# Patient Record
Sex: Female | Born: 1940 | Race: White | Hispanic: No | Marital: Married | Smoking: Former smoker
Health system: Southern US, Community
[De-identification: ages and names within clinical notes are randomized; demographics above are authoritative.]

## PROBLEM LIST (undated history)

## (undated) DIAGNOSIS — M8580 Other specified disorders of bone density and structure, unspecified site: Secondary | ICD-10-CM

## (undated) DIAGNOSIS — R32 Unspecified urinary incontinence: Secondary | ICD-10-CM

## (undated) DIAGNOSIS — N390 Urinary tract infection, site not specified: Secondary | ICD-10-CM

## (undated) DIAGNOSIS — T7840XA Allergy, unspecified, initial encounter: Secondary | ICD-10-CM

## (undated) DIAGNOSIS — R011 Cardiac murmur, unspecified: Secondary | ICD-10-CM

## (undated) DIAGNOSIS — E079 Disorder of thyroid, unspecified: Secondary | ICD-10-CM

## (undated) DIAGNOSIS — K219 Gastro-esophageal reflux disease without esophagitis: Secondary | ICD-10-CM

## (undated) HISTORY — DX: Other specified disorders of bone density and structure, unspecified site: M85.80

## (undated) HISTORY — DX: Cardiac murmur, unspecified: R01.1

## (undated) HISTORY — DX: Allergy, unspecified, initial encounter: T78.40XA

## (undated) HISTORY — DX: Disorder of thyroid, unspecified: E07.9

## (undated) HISTORY — DX: Gastro-esophageal reflux disease without esophagitis: K21.9

## (undated) HISTORY — PX: BREAST BIOPSY: SHX20

## (undated) HISTORY — DX: Unspecified urinary incontinence: R32

## (undated) HISTORY — DX: Urinary tract infection, site not specified: N39.0

---

## 2001-11-05 ENCOUNTER — Other Ambulatory Visit: Admission: RE | Admit: 2001-11-05 | Discharge: 2001-11-05 | Payer: Self-pay | Admitting: Gynecology

## 2002-03-11 ENCOUNTER — Encounter: Payer: Self-pay | Admitting: Gynecology

## 2002-03-11 ENCOUNTER — Encounter: Admission: RE | Admit: 2002-03-11 | Discharge: 2002-03-11 | Payer: Self-pay | Admitting: Gynecology

## 2003-03-03 ENCOUNTER — Other Ambulatory Visit: Admission: RE | Admit: 2003-03-03 | Discharge: 2003-03-03 | Payer: Self-pay | Admitting: Gynecology

## 2004-04-12 ENCOUNTER — Other Ambulatory Visit: Admission: RE | Admit: 2004-04-12 | Discharge: 2004-04-12 | Payer: Self-pay | Admitting: Gynecology

## 2004-06-27 ENCOUNTER — Ambulatory Visit (HOSPITAL_COMMUNITY): Admission: RE | Admit: 2004-06-27 | Discharge: 2004-06-27 | Payer: Self-pay | Admitting: Gynecology

## 2004-09-04 ENCOUNTER — Emergency Department (HOSPITAL_COMMUNITY): Admission: EM | Admit: 2004-09-04 | Discharge: 2004-09-04 | Payer: Self-pay | Admitting: Family Medicine

## 2005-05-03 ENCOUNTER — Other Ambulatory Visit: Admission: RE | Admit: 2005-05-03 | Discharge: 2005-05-03 | Payer: Self-pay | Admitting: Gynecology

## 2005-12-13 ENCOUNTER — Ambulatory Visit (HOSPITAL_COMMUNITY): Admission: RE | Admit: 2005-12-13 | Discharge: 2005-12-13 | Payer: Self-pay | Admitting: Gynecology

## 2006-06-11 ENCOUNTER — Other Ambulatory Visit: Admission: RE | Admit: 2006-06-11 | Discharge: 2006-06-11 | Payer: Self-pay | Admitting: Gynecology

## 2007-02-21 HISTORY — PX: ENDOMETRIAL BIOPSY: SHX622

## 2007-08-06 ENCOUNTER — Encounter (INDEPENDENT_AMBULATORY_CARE_PROVIDER_SITE_OTHER): Payer: Self-pay | Admitting: Gynecology

## 2007-08-06 ENCOUNTER — Ambulatory Visit (HOSPITAL_BASED_OUTPATIENT_CLINIC_OR_DEPARTMENT_OTHER): Admission: RE | Admit: 2007-08-06 | Discharge: 2007-08-06 | Payer: Self-pay | Admitting: Gynecology

## 2008-05-25 ENCOUNTER — Encounter: Admission: RE | Admit: 2008-05-25 | Discharge: 2008-05-25 | Payer: Self-pay | Admitting: Gynecology

## 2009-03-19 ENCOUNTER — Encounter: Admission: RE | Admit: 2009-03-19 | Discharge: 2009-03-19 | Payer: Self-pay | Admitting: Family Medicine

## 2009-04-19 ENCOUNTER — Encounter: Admission: RE | Admit: 2009-04-19 | Discharge: 2009-04-19 | Payer: Self-pay | Admitting: Family Medicine

## 2009-08-26 ENCOUNTER — Encounter: Admission: RE | Admit: 2009-08-26 | Discharge: 2009-08-26 | Payer: Self-pay | Admitting: Family Medicine

## 2009-09-01 ENCOUNTER — Encounter: Admission: RE | Admit: 2009-09-01 | Discharge: 2009-09-01 | Payer: Self-pay | Admitting: Gynecology

## 2010-07-05 NOTE — Op Note (Signed)
NAMEJESSLYN, VIGLIONE NO.:  [id number]   MEDICAL RECORD NO.:  [id number]          PATIENT TYPE:  AMB   LOCATION:  [facility]                         FACILITY:  [facility]   PHYSICIAN:  Gretta Cool, M.D. DATE OF BIRTH:  April 26, 1940   DATE OF PROCEDURE:  08/06/2007  DATE OF DISCHARGE:                               OPERATIVE REPORT   PREOPERATIVE DIAGNOSES:  1. Endometrial polyp, with abnormal uterine bleeding.  2. 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   POSTOPERATIVE DIAGNOSES:  1. Endometrial polyp, with abnormal uterine bleeding.  2. 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  3. Superficial adenomyosis.   SURGEON:  Gretta Cool, M.D.   ANESTHESIA:  IV sedation, and paracervical block.   DESCRIPTION OF PROCEDURE:  Under excellent anesthesia, as above, with  the patient prepped and draped in the lithotomy position in Power  stirrups, bladder drained.  A weighted speculum was placed in the  vagina.  The cervix was then grasped with a single-tooth tenaculum and,  after paracervical anesthesia was administered, the cervix was  progressively dilated.  The cervix was dilated to 33 Pratt, and the 7 mm  resectoscope introduced.  The cavity was then photographed, and a large  anterior uterine wall sessile polyp demonstrated.  There was evidence of  previous partial resection of the polyp.  At this point, note the  patient had had a D&C in the office previously.  The polyp was then  progressively resected totally.  Once the polyp was resected, the entire  endometrial cavity was progressively resected so as to remove all viable  endometrial tissue down to a depth of 5 mm or greater into the muscle  wall of the uterus.  Cornual areas were treated by touch technique.  At  this point, the Vapotrode electrode was applied, and the entire cavity  treated with Vapotrode 360 degrees around the cavity so as to eliminate  islands of superficial adenomyosis and  endometriosis in the surface of  the remaining myometrial wall.  At this point, with reduced pressure,  there was no significant bleeding.  The patient returned to the recovery  room in excellent condition, without complication.           ______________________________  Gretta Cool, M.D.     CWL/MEDQ  D:  08/06/2007  T:  08/06/2007  Job:  [id number]   cc:   Ace Gins, MD

## 2010-11-17 LAB — POCT HEMOGLOBIN-HEMACUE: Operator id: 114531

## 2010-11-21 ENCOUNTER — Other Ambulatory Visit: Payer: Self-pay | Admitting: Gynecology

## 2010-12-12 IMAGING — CT CT CHEST W/O CM
2 of 3 series · 14 of 31 positions shown, 16 images · non-contrast
Comparison: CT of the chest of 04/19/2009

CLINICAL DATA: Follow up of ground-glass lesions noted on prior CT
of the chest

CT CHEST WITHOUT CONTRAST
TECHNIQUE: Multidetector CT imaging of the chest was performed
following the standard protocol without IV contrast.

[Series 3: routine chest · axial · 0.66mm/px · z∈[-205,-5]mm · 6 of 57 slices shown, 8 images]
[im 9/57  mediastinal]
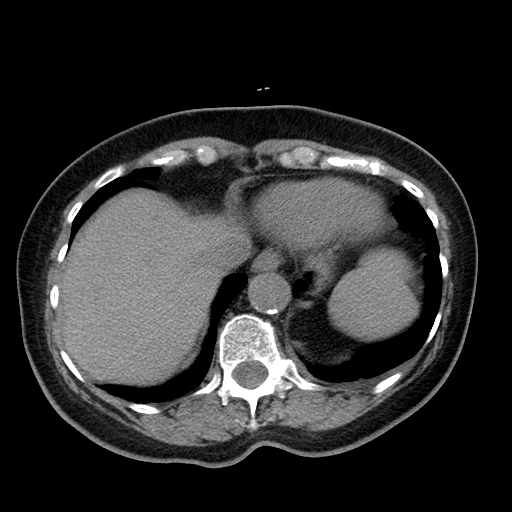
[im 9/57  lung]
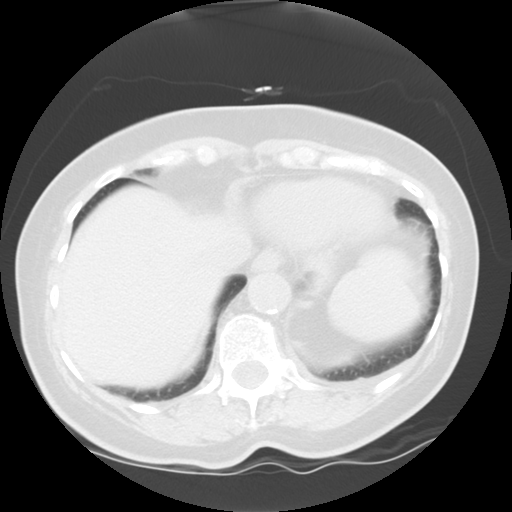
[im 17/57  lung]
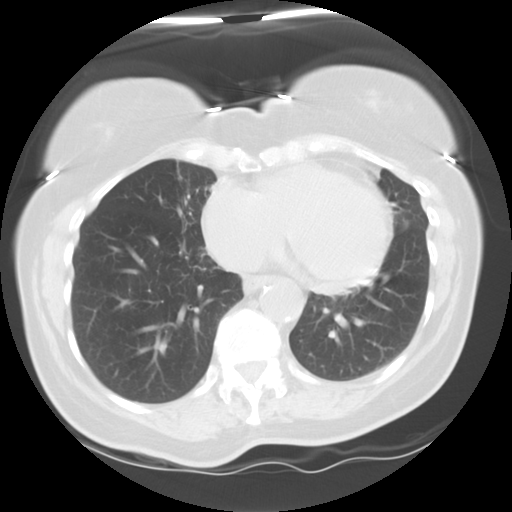
[im 25/57  lung]
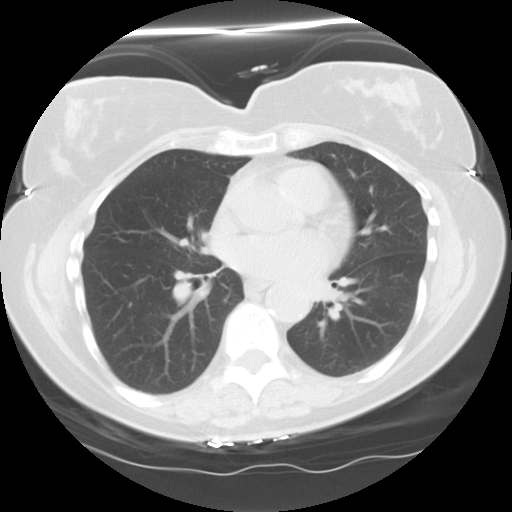
[im 33/57  lung]
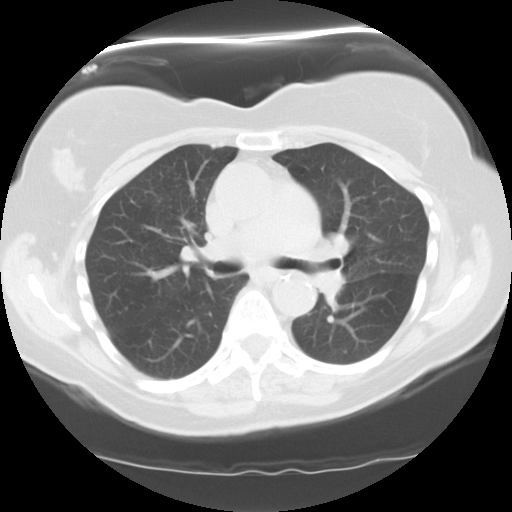
[im 41/57  mediastinal]
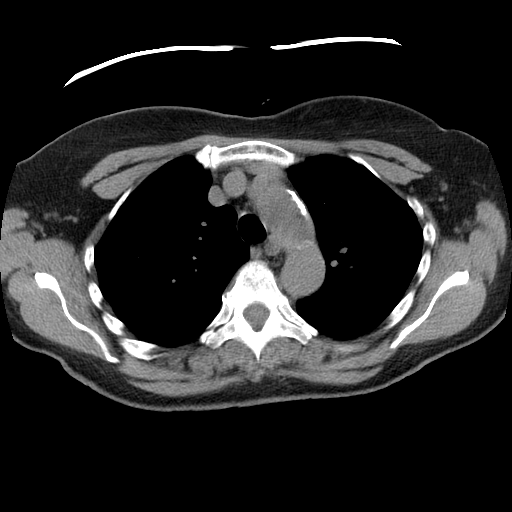
[im 41/57  lung]
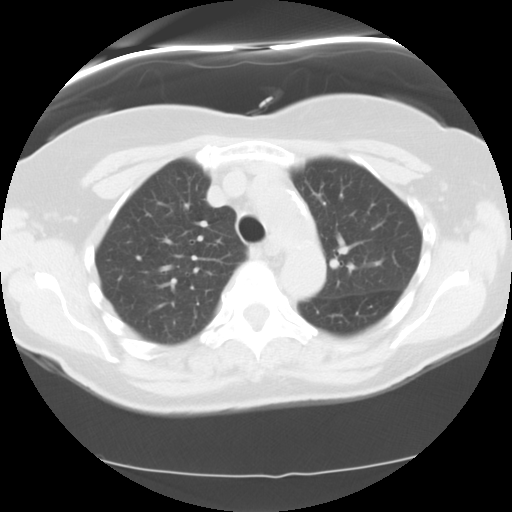
[im 49/57  lung]
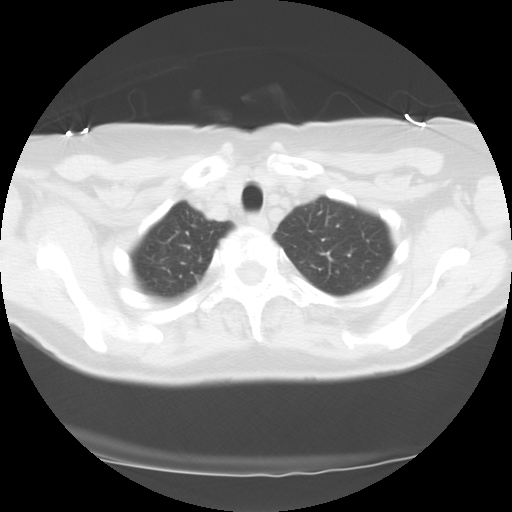

[Series 602: sagittal body · sagittal · 0.66mm/px · 8 of 136 slices shown]
[im 15/136  mediastinal]
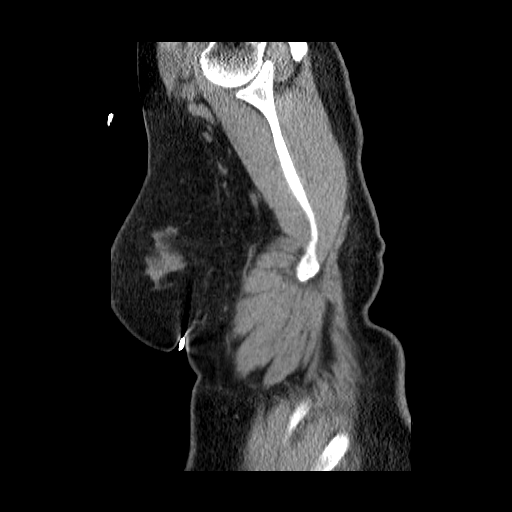
[im 29/136  mediastinal]
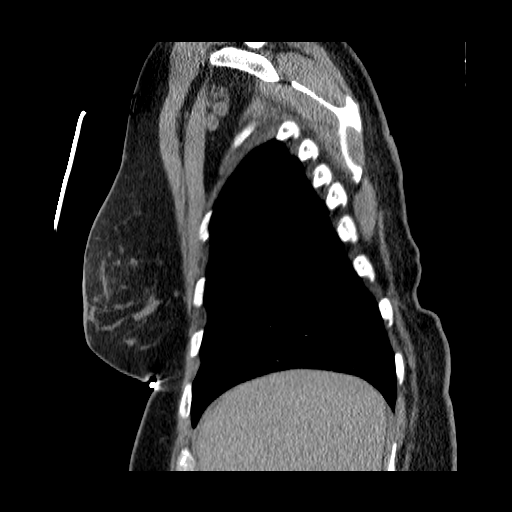
[im 43/136  mediastinal]
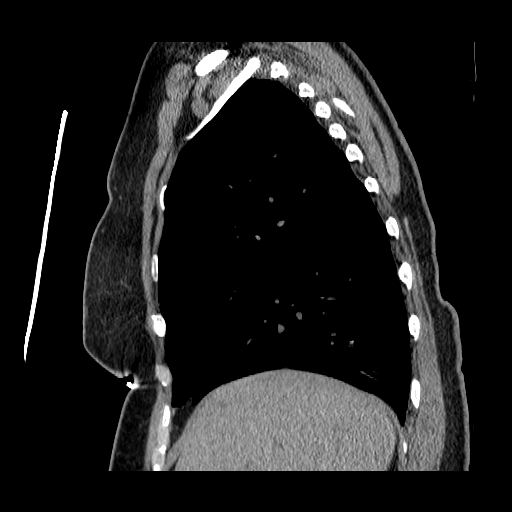
[im 64/136  mediastinal]
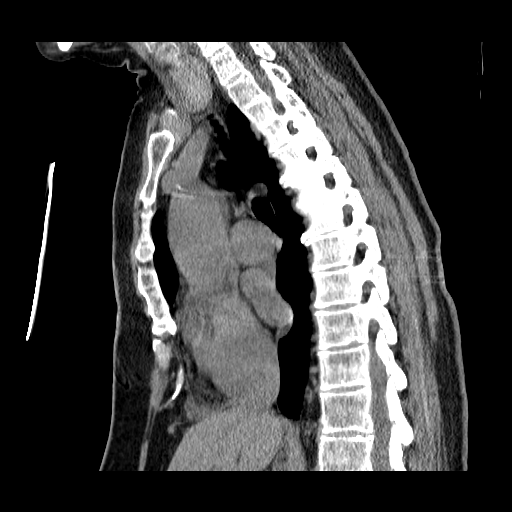
[im 72/136  mediastinal]
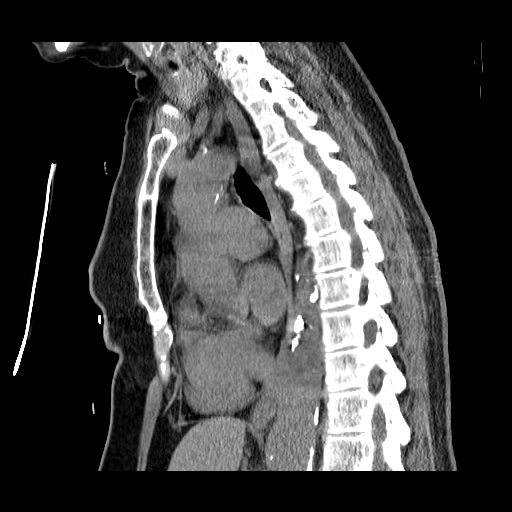
[im 93/136  mediastinal]
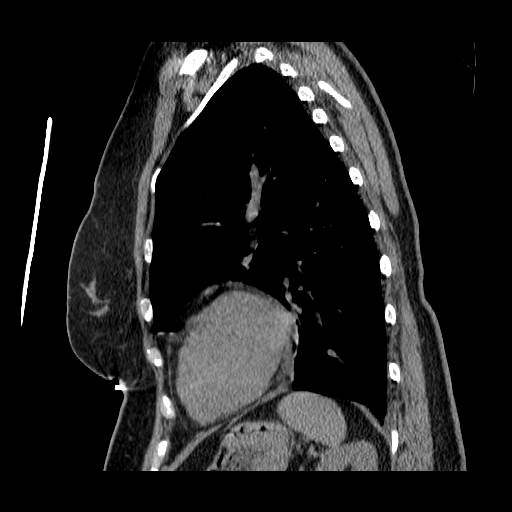
[im 107/136  mediastinal]
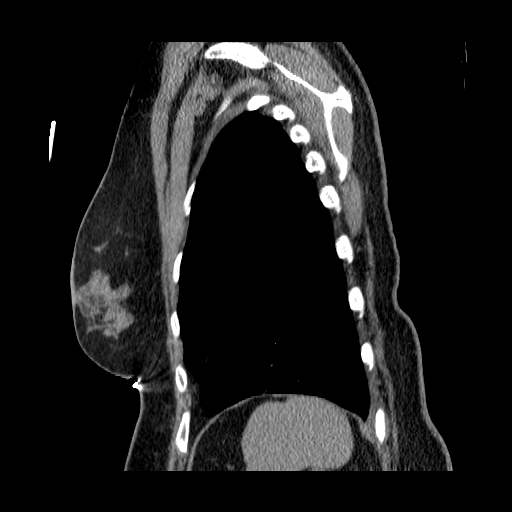
[im 121/136  mediastinal]
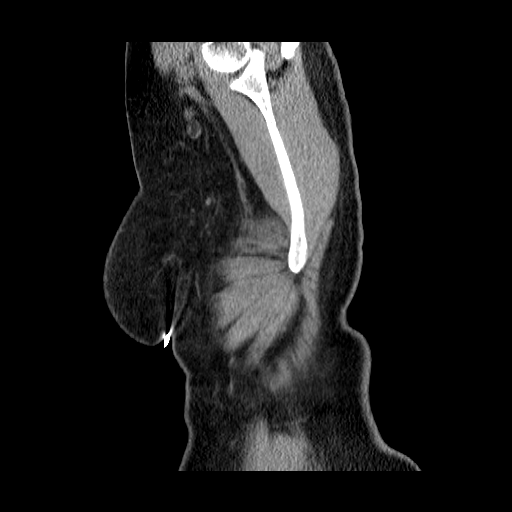

[14 of 31 positions shown; findings below may reference images not displayed]

FINDINGS: The more prominent of the previously noted three ground-
glass opacities on the right is stable within the right lower lobe.
A smaller ground-glass opacity in the right middle lobe is stable.
The very tiny ground-glass opacity questioned previously in the
right lower lobe more posteriorly is not seen on the current exam.
The calcified granuloma in the right upper lobe is stable.  No new
lung nodule is seen.  Since ground-glass opacities may represent
bronchoalveolar cell carcinoma and can be very slow growing,
follow-up CT of the chest is recommended in 6-12 months.  No
pleural effusion is seen.

On soft tissue window images, no mediastinal or hilar adenopathy is
seen.  Somewhat prominent pulmonary artery segments are stable.
Calcification of the descending thoracic aorta is again noted.
Mild cardiomegaly is stable.  No abnormality is seen of the upper
abdomen.
IMPRESSION: Two of the three previously described ground-glass opacities on
the right are stable, and the third previously described opacity is
no longer seen.  Recommend an additional follow-up CT chest in 6-12
months.

## 2011-06-14 ENCOUNTER — Encounter: Payer: Self-pay | Admitting: Family Medicine

## 2011-06-14 ENCOUNTER — Ambulatory Visit (INDEPENDENT_AMBULATORY_CARE_PROVIDER_SITE_OTHER): Payer: Medicare Other | Admitting: Family Medicine

## 2011-06-14 VITALS — BP 132/84 | HR 72 | Temp 98.0°F | Resp 12 | Ht 67.25 in | Wt 164.0 lb

## 2011-06-14 DIAGNOSIS — K219 Gastro-esophageal reflux disease without esophagitis: Secondary | ICD-10-CM

## 2011-06-14 DIAGNOSIS — E039 Hypothyroidism, unspecified: Secondary | ICD-10-CM | POA: Insufficient documentation

## 2011-06-14 DIAGNOSIS — J309 Allergic rhinitis, unspecified: Secondary | ICD-10-CM

## 2011-06-14 DIAGNOSIS — J3089 Other allergic rhinitis: Secondary | ICD-10-CM

## 2011-06-14 DIAGNOSIS — F5104 Psychophysiologic insomnia: Secondary | ICD-10-CM | POA: Insufficient documentation

## 2011-06-14 DIAGNOSIS — G4700 Insomnia, unspecified: Secondary | ICD-10-CM | POA: Diagnosis not present

## 2011-06-14 NOTE — Progress Notes (Signed)
  Subjective:    Patient ID: [name] Stable, female    DOB: [birth year], 71 y.o.   MRN: [id number]  HPI  New to establish care.  PMH reviewed. History of childhood asthma but no problems since age 69. History of GERD on Nexium. She's tried stopping several times without success. She has hypothyroidism treated with armour thyroid. She tried synthetic thyroid but had side effects. She has history of some urine incontinence symptoms. Perennial allergies. Tried over-the-counter anti histamines without much improvement. She has not had thyroid functions in over one year.  History of benign endometrial polyp 2009. She sees gynecologist regularly.  FH significant for mother with stroke and hypertension history. Paternal grandfather with colon cancer.  Patient is married. Ex-smoker. One alcoholic beverage per day.  Has refused colonoscopy screening in the past. Pneumovax and tetanus up-to-date. Previous shingles vaccine 2011   Review of Systems  Constitutional: Negative for fever, activity change, appetite change, fatigue and unexpected weight change.  HENT: Negative for hearing loss, ear pain, sore throat and trouble swallowing.   Eyes: Negative for visual disturbance.  Respiratory: Negative for cough and shortness of breath.   Cardiovascular: Negative for chest pain and palpitations.  Gastrointestinal: Negative for abdominal pain, diarrhea, constipation and blood in stool.  Genitourinary: Negative for dysuria and hematuria.  Musculoskeletal: Negative for myalgias, back pain and arthralgias.  Skin: Negative for rash.  Neurological: Negative for dizziness, syncope and headaches.  Hematological: Negative for adenopathy.  Psychiatric/Behavioral: Negative for confusion and dysphoric mood.       Objective:   Physical Exam  Constitutional: She is oriented to person, place, and time. She appears well-developed and well-nourished.  HENT:  Head: Normocephalic and atraumatic.  Eyes: EOM are normal.  Pupils are equal, round, and reactive to light.  Neck: Normal range of motion. Neck supple. No thyromegaly present.  Cardiovascular: Normal rate, regular rhythm and normal heart sounds.   No murmur heard. Pulmonary/Chest: Breath sounds normal. No respiratory distress. She has no wheezes. She has no rales.  Abdominal: Soft. Bowel sounds are normal. She exhibits no distension and no mass. There is no tenderness. There is no rebound and no guarding.  Musculoskeletal: Normal range of motion. She exhibits no edema.  Lymphadenopathy:    She has no cervical adenopathy.  Neurological: She is alert and oriented to person, place, and time. She displays normal reflexes. No cranial nerve deficit.  Skin: No rash noted.  Psychiatric: She has a normal mood and affect. Her behavior is normal. Judgment and thought content normal.          Assessment & Plan:  #1 perennial allergies. We'll try to get prior authorization for XYZAL.   #2 hypothyroidism. Recheck TSH #3 history of GERD stable on Nexium. Continue current medication. She's tried stopping several times in the past without success.

## 2011-06-15 NOTE — Progress Notes (Signed)
Quick Note:  Pt informed, copy mailed to her home ______ 

## 2012-01-02 DIAGNOSIS — G4700 Insomnia, unspecified: Secondary | ICD-10-CM | POA: Diagnosis not present

## 2012-01-02 DIAGNOSIS — N926 Irregular menstruation, unspecified: Secondary | ICD-10-CM | POA: Diagnosis not present

## 2012-02-01 ENCOUNTER — Encounter: Payer: Self-pay | Admitting: Family Medicine

## 2012-02-01 ENCOUNTER — Ambulatory Visit (INDEPENDENT_AMBULATORY_CARE_PROVIDER_SITE_OTHER): Payer: Medicare Other | Admitting: Family Medicine

## 2012-02-01 VITALS — BP 150/90 | Temp 98.0°F | Wt 159.0 lb

## 2012-02-01 DIAGNOSIS — J209 Acute bronchitis, unspecified: Secondary | ICD-10-CM | POA: Diagnosis not present

## 2012-02-01 MED ORDER — BENZONATATE 200 MG PO CAPS: 200.0000 mg | ORAL_CAPSULE | Freq: Three times a day (TID) | ORAL | Status: DC | PRN

## 2012-02-01 MED ORDER — HYDROCODONE-HOMATROPINE 5-1.5 MG/5ML PO SYRP: 5.0000 mL | ORAL_SOLUTION | Freq: Four times a day (QID) | ORAL | Status: AC | PRN

## 2012-02-01 NOTE — Patient Instructions (Addendum)

## 2012-02-01 NOTE — Progress Notes (Signed)
  Subjective:    Patient ID: Taylor Kelley, female    DOB: 1940-08-29, 71 y.o.   MRN: [id number]  HPI  One-week history sore throat dry cough and mild malaise. No postnasal drip. Sore throat especially at night. Cough is worse at night. No fever or chills. Took Aleve without much relief. No headaches. No nausea or vomiting. No sick contacts. Quit smoking 1980   Review of Systems  Constitutional: Negative for fever, chills and fatigue.  HENT: Positive for congestion and sore throat. Negative for voice change.   Respiratory: Positive for cough. Negative for shortness of breath and wheezing.   Neurological: Negative for headaches.       Objective:   Physical Exam  Constitutional: She appears well-developed and well-nourished.  HENT:  Right Ear: External ear normal.  Left Ear: External ear normal.  Mouth/Throat: Oropharynx is clear and moist.  Neck: Neck supple. No thyromegaly present.  Cardiovascular: Normal rate and regular rhythm.   Pulmonary/Chest: Effort normal and breath sounds normal. No respiratory distress. She has no wheezes. She has no rales.  Lymphadenopathy:    She has no cervical adenopathy.          Assessment & Plan:  Acute bronchitis. Suspect viral. Hycodan cough syrup for nighttime use as needed

## 2012-02-28 ENCOUNTER — Other Ambulatory Visit: Payer: Self-pay | Admitting: *Deleted

## 2012-02-28 MED ORDER — ESOMEPRAZOLE MAGNESIUM 40 MG PO CPDR: 40.0000 mg | DELAYED_RELEASE_CAPSULE | Freq: Every day | ORAL | Status: DC

## 2012-05-24 ENCOUNTER — Other Ambulatory Visit: Payer: Self-pay | Admitting: *Deleted

## 2012-05-24 MED ORDER — THYROID 60 MG PO TABS: 60.0000 mg | ORAL_TABLET | Freq: Every day | ORAL | Status: DC

## 2012-07-09 ENCOUNTER — Other Ambulatory Visit: Payer: Self-pay | Admitting: Family Medicine

## 2012-10-02 ENCOUNTER — Other Ambulatory Visit: Payer: Self-pay | Admitting: Family Medicine

## 2012-12-26 ENCOUNTER — Ambulatory Visit: Payer: Self-pay | Admitting: Gynecology

## 2012-12-28 ENCOUNTER — Other Ambulatory Visit: Payer: Self-pay | Admitting: Family Medicine

## 2013-01-03 ENCOUNTER — Ambulatory Visit: Payer: Self-pay | Admitting: Gynecology

## 2013-01-09 ENCOUNTER — Ambulatory Visit: Payer: Self-pay | Admitting: Gynecology

## 2013-01-20 ENCOUNTER — Encounter: Payer: Self-pay | Admitting: Gynecology

## 2013-01-20 ENCOUNTER — Ambulatory Visit (INDEPENDENT_AMBULATORY_CARE_PROVIDER_SITE_OTHER): Payer: Medicare Other | Admitting: Gynecology

## 2013-01-20 VITALS — BP 130/82 | Ht 67.0 in | Wt 162.0 lb

## 2013-01-20 DIAGNOSIS — N952 Postmenopausal atrophic vaginitis: Secondary | ICD-10-CM

## 2013-01-20 DIAGNOSIS — Z780 Asymptomatic menopausal state: Secondary | ICD-10-CM | POA: Diagnosis not present

## 2013-01-20 DIAGNOSIS — Z79890 Hormone replacement therapy (postmenopausal): Secondary | ICD-10-CM

## 2013-01-20 MED ORDER — PROGESTERONE MICRONIZED 200 MG PO CAPS: 200.0000 mg | ORAL_CAPSULE | Freq: Every day | ORAL | Status: DC

## 2013-01-20 MED ORDER — ESTRADIOL 0.0375 MG/24HR TD PTWK: 0.0375 mg | MEDICATED_PATCH | TRANSDERMAL | Status: DC

## 2013-01-20 NOTE — Progress Notes (Signed)
Taylor Kelley Dec 23, 1940 [id number]        72 y.o.  G3P3003 new patient for followup exam.  Former patient of Dr. Nicholas Lose. Several issues noted below.  Past medical history,surgical history, problem list, medications, allergies, family history and social history were all reviewed and documented in the EPIC chart.  ROS:  Performed and pertinent positives and negatives are included in the history, assessment and plan .  Exam:  Kim assistant Filed Vitals:   01/20/13 1021  BP: 130/82  Height: 5\' 7"  (1.702 m)  Weight: 162 lb (73.483 kg)   General appearance  Normal Skin grossly normal Head/Neck normal with no cervical or supraclavicular adenopathy thyroid normal Lungs  clear Cardiac RR, without RMG Abdominal  soft, nontender, without masses, organomegaly or hernia Breasts  examined lying and sitting without masses, retractions, discharge or axillary adenopathy. Pelvic  Ext/BUS/vagina  Normal with atrophic changes  Cervix  with atrophic changes. Old obstetrical lacerations.  Uterus  axial, normal size, shape and contour, midline and mobile nontender   Adnexa  Without masses or tenderness    Anus and perineum  Normal   Rectovaginal  Normal sphincter tone without palpated masses or tenderness.    Assessment/Plan:  72 y.o. G3P3003 new patient.  1. Postmenopausal/HRT/atrophic genital changes. Patient on HRT to include Climara 0.0375 patch and Prometrium 200 mg 12 days every 3 months. No withdrawal bleeding. Has been on this for years without attempted weaning.  I reviewed the whole issue of HRT with her to include the WHI study with increased risk of stroke, heart attack, DVT and breast cancer. The ACOG and NAMS statements for lowest dose for the shortest period of time reviewed. Transdermal versus oral first-pass effect benefit discussed. After lengthy discussion she agrees to weaning off of the HRT. Assuming she continues well them Will follow. If she does have unacceptable hot flashes and  sweats and wants to reinitiate she will call. 2. Pap smear 2012. No Pap smear done today. No history of abnormal Pap smear previously. Reviewed current screening guidelines we both agreed to stop screening and she is comfortable with this. 3. Mammography due now she knows to schedule this. SBE monthly reviewed. 4. DEXA 2007. Recommend repeat DEXA now and she agrees to do so. Increase calcium vitamin D reviewed. 5. Colonoscopy never. Benefits of pre-cancerous polyp removal and early detection discussed. Strongly recommended patient schedule colonoscopy and she refuses. 6. Health maintenance. No blood work done as this will be done through her primary physician's office who she is arranging an appointment with. Followup one year, sooner as needed.   Note: This document was prepared with digital dictation and possible smart phrase technology. Any transcriptional errors that result from this process are unintentional.   Dara Lords MD, 11:12 AM 01/20/2013

## 2013-01-20 NOTE — Patient Instructions (Signed)
Recommend screening colonoscopy. Wean from hormone replacement as we discussed. Call if any issues. Followup in one year for annual exam.

## 2013-01-21 LAB — URINALYSIS W MICROSCOPIC + REFLEX CULTURE
Bacteria, UA: NONE SEEN
Bilirubin Urine: NEGATIVE
Crystals: NONE SEEN
Glucose, UA: NEGATIVE mg/dL
Ketones, ur: NEGATIVE mg/dL
Nitrite: NEGATIVE
Protein, ur: NEGATIVE mg/dL
Squamous Epithelial / LPF: NONE SEEN
Urobilinogen, UA: 0.2 mg/dL (ref 0.0–1.0)
pH: 5.5 (ref 5.0–8.0)

## 2013-01-27 ENCOUNTER — Ambulatory Visit (INDEPENDENT_AMBULATORY_CARE_PROVIDER_SITE_OTHER): Payer: Medicare Other | Admitting: Family Medicine

## 2013-01-27 ENCOUNTER — Encounter: Payer: Self-pay | Admitting: Family Medicine

## 2013-01-27 VITALS — BP 130/72 | HR 79 | Temp 98.3°F | Wt 163.0 lb

## 2013-01-27 DIAGNOSIS — K219 Gastro-esophageal reflux disease without esophagitis: Secondary | ICD-10-CM

## 2013-01-27 DIAGNOSIS — G4700 Insomnia, unspecified: Secondary | ICD-10-CM

## 2013-01-27 DIAGNOSIS — R5381 Other malaise: Secondary | ICD-10-CM | POA: Diagnosis not present

## 2013-01-27 DIAGNOSIS — E039 Hypothyroidism, unspecified: Secondary | ICD-10-CM

## 2013-01-27 DIAGNOSIS — R5383 Other fatigue: Secondary | ICD-10-CM

## 2013-01-27 DIAGNOSIS — F5104 Psychophysiologic insomnia: Secondary | ICD-10-CM

## 2013-01-27 LAB — VITAMIN B12: Vitamin B-12: >1500 pg/mL — ABNORMAL HIGH (ref 211–911)

## 2013-01-27 MED ORDER — ESOMEPRAZOLE MAGNESIUM 40 MG PO CPDR: 40.0000 mg | DELAYED_RELEASE_CAPSULE | Freq: Every day | ORAL | Status: DC

## 2013-01-27 MED ORDER — THYROID 60 MG PO TABS: 60.0000 mg | ORAL_TABLET | Freq: Every day | ORAL | Status: DC

## 2013-01-27 MED ORDER — TRAZODONE HCL 150 MG PO TABS: ORAL_TABLET | ORAL | Status: DC

## 2013-01-27 MED ORDER — LEVOCETIRIZINE DIHYDROCHLORIDE 5 MG PO TABS: 5.0000 mg | ORAL_TABLET | Freq: Every evening | ORAL | Status: DC

## 2013-01-27 NOTE — Progress Notes (Signed)
Pre visit review using our clinic review tool, if applicable. No additional management support is needed unless otherwise documented below in the visit note. 

## 2013-01-27 NOTE — Progress Notes (Signed)
   Subjective:    Patient ID: Taylor Kelley, female    DOB: June 14, 1940, 72 y.o.   MRN: [id number]  HPI Patient is seen for medical followup. She has history of chronic insomnia, GERD, hypothyroidism, and perennial allergic rhinitis. She still sees gynecologist. He recommended she scale back on her estrogen use. She has allergies that are controlled with Levocetirizine. GERD which has been controlled on Nexium. She's tried stopping this multiple times with recurrent symptoms. She takes Armour Thyroid and consistent with using this. She has chronic insomnia takes trazodone at night. She has chronic fatigue. Exercises 3-4 days per week about 30 minutes per day but still feels fatigued. Does have poor sleep still at times. No chest pains. No dizziness. Denies any depressive symptoms.  Past Medical History  Diagnosis Date  . GERD (gastroesophageal reflux disease)   . Allergy   . Heart murmur   . Thyroid disease   . Urine incontinence   . UTI (urinary tract infection)    Past Surgical History  Procedure Laterality Date  . Endometrial biopsy  2009    polyp    reports that she quit smoking about 34 years ago. Her smoking use included Cigarettes. She has a 10 pack-year smoking history. She does not have any smokeless tobacco history on file. She reports that she drinks about 3.5 ounces of alcohol per week. She reports that she does not use illicit drugs. family history includes Breast cancer (age of onset: 49) in her other; Breast cancer (age of onset: 7) in her sister; Cancer in her paternal grandfather; Heart disease in her father and mother; Hypertension in her mother; Stroke in her mother. No Known Allergies    Review of Systems  Constitutional: Positive for fatigue.  Eyes: Negative for visual disturbance.  Respiratory: Negative for cough, chest tightness, shortness of breath and wheezing.   Cardiovascular: Negative for chest pain, palpitations and leg swelling.  Neurological: Negative  for dizziness, seizures, syncope, weakness, light-headedness and headaches.       Objective:   Physical Exam  Constitutional: She appears well-developed and well-nourished. No distress.  HENT:  Mouth/Throat: Oropharynx is clear and moist.  Neck: Neck supple. No thyromegaly present.  Cardiovascular: Normal rate and regular rhythm.   Pulmonary/Chest: Effort normal and breath sounds normal. No respiratory distress. She has no wheezes. She has no rales.  Musculoskeletal: She exhibits no edema.          Assessment & Plan:  #1 hypothyroidism. Recheck TSH #2 history of chronic GERD. Chronic PPI use. Check B12 level #3 fatigue. Likely multifactorial. Check TSH, check B12, check vitamin D level. #4 chronic insomnia. We've talked about sleep hygiene. Recommend she try to scale back her trazodone to one half tablet at night

## 2013-02-26 ENCOUNTER — Telehealth: Payer: Self-pay | Admitting: Family Medicine

## 2013-02-26 NOTE — Telephone Encounter (Signed)
The PA for Armour has been denied, it is not covered under the pt's plan.  I was not given any alternative medications.

## 2013-02-26 NOTE — Telephone Encounter (Signed)
If this is not covered, her options are:  #1) pay out of pocket for medication  #2) go on synthetic hormone- which is what most people take.  Let me know if she is willing to go on synthetic hormone.

## 2013-02-27 NOTE — Telephone Encounter (Signed)
Pt stated that she will pay out of pocket for the medication.

## 2013-03-04 ENCOUNTER — Telehealth: Payer: Self-pay | Admitting: *Deleted

## 2013-03-04 MED ORDER — ESTRADIOL 0.0375 MG/24HR TD PTWK: 0.0375 mg | MEDICATED_PATCH | TRANSDERMAL | Status: DC

## 2013-03-04 MED ORDER — PROGESTERONE MICRONIZED 100 MG PO CAPS: ORAL_CAPSULE | ORAL | Status: DC

## 2013-03-04 NOTE — Telephone Encounter (Signed)
Pt tried to wean off Climara 0.0375 patch and Prometrium 200 mg 12 days every 3 months as noted on 01/20/13 OV note. Pt said hot flashes and no sleep has returned. Please advise

## 2013-03-04 NOTE — Telephone Encounter (Signed)
Would recommend Climara 0.0375 patch and Prometrium 100 mg nightly

## 2013-03-04 NOTE — Telephone Encounter (Signed)
Pt called informed with the below note, rx sent.

## 2013-04-20 DIAGNOSIS — M8580 Other specified disorders of bone density and structure, unspecified site: Secondary | ICD-10-CM

## 2013-04-20 HISTORY — DX: Other specified disorders of bone density and structure, unspecified site: M85.80

## 2013-05-06 ENCOUNTER — Encounter: Payer: Self-pay | Admitting: Gynecology

## 2013-05-06 ENCOUNTER — Ambulatory Visit (INDEPENDENT_AMBULATORY_CARE_PROVIDER_SITE_OTHER): Payer: Medicare Other

## 2013-05-06 ENCOUNTER — Other Ambulatory Visit: Payer: Self-pay | Admitting: Gynecology

## 2013-05-06 DIAGNOSIS — M8580 Other specified disorders of bone density and structure, unspecified site: Secondary | ICD-10-CM

## 2013-05-06 DIAGNOSIS — M899 Disorder of bone, unspecified: Secondary | ICD-10-CM | POA: Diagnosis not present

## 2013-05-06 DIAGNOSIS — M949 Disorder of cartilage, unspecified: Secondary | ICD-10-CM

## 2013-05-06 DIAGNOSIS — Z780 Asymptomatic menopausal state: Secondary | ICD-10-CM

## 2013-06-12 ENCOUNTER — Encounter (HOSPITAL_COMMUNITY): Payer: Self-pay | Admitting: Emergency Medicine

## 2013-06-12 ENCOUNTER — Emergency Department (HOSPITAL_COMMUNITY)
Admission: EM | Admit: 2013-06-12 | Discharge: 2013-06-12 | Disposition: A | Discharge status: Home / Self Care | Payer: Medicare Other | Attending: Emergency Medicine | Admitting: Emergency Medicine

## 2013-06-12 ENCOUNTER — Emergency Department (HOSPITAL_COMMUNITY): Payer: Medicare Other

## 2013-06-12 DIAGNOSIS — S0003XA Contusion of scalp, initial encounter: Secondary | ICD-10-CM | POA: Diagnosis not present

## 2013-06-12 DIAGNOSIS — M949 Disorder of cartilage, unspecified: Secondary | ICD-10-CM

## 2013-06-12 DIAGNOSIS — S199XXA Unspecified injury of neck, initial encounter: Secondary | ICD-10-CM | POA: Diagnosis not present

## 2013-06-12 DIAGNOSIS — Z7982 Long term (current) use of aspirin: Secondary | ICD-10-CM | POA: Insufficient documentation

## 2013-06-12 DIAGNOSIS — E079 Disorder of thyroid, unspecified: Secondary | ICD-10-CM | POA: Diagnosis not present

## 2013-06-12 DIAGNOSIS — R32 Unspecified urinary incontinence: Secondary | ICD-10-CM | POA: Insufficient documentation

## 2013-06-12 DIAGNOSIS — Z79899 Other long term (current) drug therapy: Secondary | ICD-10-CM | POA: Insufficient documentation

## 2013-06-12 DIAGNOSIS — S0033XA Contusion of nose, initial encounter: Secondary | ICD-10-CM

## 2013-06-12 DIAGNOSIS — Z87891 Personal history of nicotine dependence: Secondary | ICD-10-CM | POA: Diagnosis not present

## 2013-06-12 DIAGNOSIS — R011 Cardiac murmur, unspecified: Secondary | ICD-10-CM | POA: Insufficient documentation

## 2013-06-12 DIAGNOSIS — S0083XA Contusion of other part of head, initial encounter: Principal | ICD-10-CM | POA: Insufficient documentation

## 2013-06-12 DIAGNOSIS — S1093XA Contusion of unspecified part of neck, initial encounter: Secondary | ICD-10-CM | POA: Diagnosis not present

## 2013-06-12 DIAGNOSIS — W010XXA Fall on same level from slipping, tripping and stumbling without subsequent striking against object, initial encounter: Secondary | ICD-10-CM | POA: Insufficient documentation

## 2013-06-12 DIAGNOSIS — S0993XA Unspecified injury of face, initial encounter: Secondary | ICD-10-CM | POA: Diagnosis not present

## 2013-06-12 DIAGNOSIS — Y9389 Activity, other specified: Secondary | ICD-10-CM | POA: Insufficient documentation

## 2013-06-12 DIAGNOSIS — Y929 Unspecified place or not applicable: Secondary | ICD-10-CM | POA: Insufficient documentation

## 2013-06-12 DIAGNOSIS — M899 Disorder of bone, unspecified: Secondary | ICD-10-CM | POA: Insufficient documentation

## 2013-06-12 DIAGNOSIS — IMO0002 Reserved for concepts with insufficient information to code with codable children: Secondary | ICD-10-CM | POA: Insufficient documentation

## 2013-06-12 NOTE — ED Notes (Addendum)
Pt reports tripping and striking nose on metal bar. Pt now reports 5/10 pain to nose. No LOC, denies HA. PERRLA 16mm, neuro intact. Concerned nose could be broken. Reports nose bleeding initially, but no bleeding at this time. AO x4.

## 2013-06-12 NOTE — ED Provider Notes (Signed)
CSN: [id number]     Arrival date & time 06/12/13  8416 History  This chart was scribed for non-physician practitioner, Junius Creamer, [place] working with Merryl Hacker, MD by Frederich Balding, ED scribe. This patient was seen in room TR09C/TR09C and the patient's care was started at 7:25 PM.   Chief Complaint  Patient presents with  . Facial Injury   The history is provided by the patient. No language interpreter was used.   HPI Comments: Taylor Kelley is a 73 y.o. female who presents to the Emergency Department complaining of facial injury that occurred earlier today. Pt was dragging a large bag, tripped and face planted on a metal bar. Denies LOC. Her nose bled initially but denies any other epistaxis. She has mild pain and swelling to her nose.    Past Medical History  Diagnosis Date  . GERD (gastroesophageal reflux disease)   . Allergy   . Heart murmur   . Thyroid disease   . Urine incontinence   . UTI (urinary tract infection)   . Osteopenia  04/2013    T score -1.7 FRAX 11%/2.1%   Past Surgical History  Procedure Laterality Date  . Endometrial biopsy  2009    polyp   Family History  Problem Relation Age of Onset  . Heart disease Mother   . Stroke Mother   . Hypertension Mother   . Heart disease Father   . Breast cancer Sister 71  . Cancer Paternal Grandfather   . Breast cancer Other 50   History  Substance Use Topics  . Smoking status: Former Smoker -- 0.50 packs/day for 20 years    Types: Cigarettes    Quit date: 06/14/1978  . Smokeless tobacco: Not on file  . Alcohol Use: 3.5 oz/week    7 drink(s) per week   OB History   Grav Para Term Preterm Abortions TAB SAB Ect Mult Living   3 3 3       3      Review of Systems  HENT: Positive for facial swelling. Negative for nosebleeds and rhinorrhea.   Eyes: Negative for visual disturbance.  Respiratory: Negative for shortness of breath.   Musculoskeletal: Negative for neck pain.  Skin: Positive for wound.   Neurological: Negative for dizziness and headaches.  All other systems reviewed and are negative.  Allergies  Review of patient's allergies indicates no known allergies.  Home Medications   Prior to Admission medications   Medication Sig Start Date End Date Taking? Authorizing Provider  aspirin 81 MG tablet Take 81 mg by mouth daily.    Historical Provider, MD  esomeprazole (NEXIUM) 40 MG capsule Take 1 capsule (40 mg total) by mouth daily before breakfast. 01/27/13   Eulas Post, MD  estradiol (CLIMARA - DOSED IN MG/24 HR) 0.0375 mg/24hr patch Place 1 patch (0.0375 mg total) onto the skin once a week. Per Dr Rexford Maus 03/04/13   Anastasio Auerbach, MD  levocetirizine (XYZAL) 5 MG tablet Take 1 tablet (5 mg total) by mouth every evening. 01/27/13   Eulas Post, MD  progesterone (PROMETRIUM) 100 MG capsule Take 1 tablet by mouth nightly 03/04/13   Anastasio Auerbach, MD  thyroid (ARMOUR) 60 MG tablet Take 1 tablet (60 mg total) by mouth daily. 01/27/13   Eulas Post, MD  traZODone (DESYREL) 150 MG tablet One po qhs 01/27/13   Eulas Post, MD   BP 148/79  Pulse 80  Temp(Src) 97.7 F (36.5 C) (Oral)  Resp 18  Ht 5\' 8"  (1.727 m)  Wt 160 lb (72.576 kg)  BMI 24.33 kg/m2  SpO2 98%  Physical Exam  Nursing note and vitals reviewed. Constitutional: She is oriented to person, place, and time. She appears well-developed and well-nourished. No distress.  HENT:  Head: Normocephalic and atraumatic.  Mouth/Throat: Oropharynx is clear and moist.  No septal hematoma. No posterior bleed. No active bleeding. Minimal swelling and discoloration to mid nose. No tenderness to bridge of the nose or periorbital.   Eyes: EOM are normal. Pupils are equal, round, and reactive to light.  Neck: Normal range of motion. Neck supple. No tracheal deviation present.  Cardiovascular: Normal rate and regular rhythm.   Pulmonary/Chest: Effort normal. No respiratory distress.  Musculoskeletal:  Normal range of motion.  Neurological: She is alert and oriented to person, place, and time.  Skin: Skin is warm and dry. No erythema.  Psychiatric: She has a normal mood and affect. Her behavior is normal.    ED Course  Procedures (including critical care time)  DIAGNOSTIC STUDIES: Oxygen Saturation is 98% on RA, normal by my interpretation.    COORDINATION OF CARE: 7:27 PM-Discussed treatment plan which includes xray with pt at bedside and pt agreed to plan.   Labs Review Labs Reviewed - No data to display  Imaging Review Dg Nasal Bones  06/12/2013   CLINICAL DATA:  Fall.  Nose pain and bleeding.  EXAM: NASAL BONES - 3+ VIEW  COMPARISON:  None.  FINDINGS: There is no evidence of fracture or other bone abnormality.  IMPRESSION: Negative.   Electronically Signed   By: Lucienne Capers M.D.   On: 06/12/2013 21:04     EKG Interpretation None      MDM  X-ray is negative for fracture or dislocation Final diagnoses:  None      I personally performed the services described in this documentation, which was scribed in my presence. The recorded information has been reviewed and is accurate.  Garald Balding, NP 06/12/13 2112

## 2013-06-12 NOTE — Discharge Instructions (Signed)
Contusion °A contusion is a deep bruise. Contusions happen when an injury causes bleeding under the skin. Signs of bruising include pain, puffiness (swelling), and discolored skin. The contusion may turn blue, purple, or yellow. °HOME CARE  °· Put ice on the injured area. °· Put ice in a plastic bag. °· Place a towel between your skin and the bag. °· Leave the ice on for 15-20 minutes, 03-04 times a day. °· Only take medicine as told by your doctor. °· Rest the injured area. °· If possible, raise (elevate) the injured area to lessen puffiness. °GET HELP RIGHT AWAY IF:  °· You have more bruising or puffiness. °· You have pain that is getting worse. °· Your puffiness or pain is not helped by medicine. °MAKE SURE YOU:  °· Understand these instructions. °· Will watch your condition. °· Will get help right away if you are not doing well or get worse. °Document Released: 07/26/2007 Document Revised: 05/01/2011 Document Reviewed: 12/12/2010 °[employer]® Patient Information ©2014 [employer]. ° °Cryotherapy °Cryotherapy means treatment with cold. Ice or gel packs can be used to reduce both pain and swelling. Ice is the most helpful within the first 24 to 48 hours after an injury or flareup from overusing a muscle or joint. Sprains, strains, spasms, burning pain, shooting pain, and aches can all be eased with ice. Ice can also be used when recovering from surgery. Ice is effective, has very few side effects, and is safe for most people to use. °PRECAUTIONS  °Ice is not a safe treatment option for people with: °· Raynaud's phenomenon. This is a condition affecting small blood vessels in the extremities. Exposure to cold may cause your problems to return. °· Cold hypersensitivity. There are many forms of cold hypersensitivity, including: °· Cold urticaria. Red, itchy hives appear on the skin when the tissues begin to warm after being iced. °· Cold erythema. This is a red, itchy rash caused by exposure to cold. °· Cold  hemoglobinuria. Red blood cells break down when the tissues begin to warm after being iced. The hemoglobin that carry oxygen are passed into the urine because they cannot combine with blood proteins fast enough. °· Numbness or altered sensitivity in the area being iced. °If you have any of the following conditions, do not use ice until you have discussed cryotherapy with your caregiver: °· Heart conditions, such as arrhythmia, angina, or chronic heart disease. °· High blood pressure. °· Healing wounds or open skin in the area being iced. °· Current infections. °· Rheumatoid arthritis. °· Poor circulation. °· Diabetes. °Ice slows the blood flow in the region it is applied. This is beneficial when trying to stop inflamed tissues from spreading irritating chemicals to surrounding tissues. However, if you expose your skin to cold temperatures for too long or without the proper protection, you can damage your skin or nerves. Watch for signs of skin damage due to cold. °HOME CARE INSTRUCTIONS °Follow these tips to use ice and cold packs safely. °· Place a dry or damp towel between the ice and skin. A damp towel will cool the skin more quickly, so you may need to shorten the time that the ice is used. °· For a more rapid response, add gentle compression to the ice. °· Ice for no more than 10 to 20 minutes at a time. The bonier the area you are icing, the less time it will take to get the benefits of ice. °· Check your skin after 5 minutes to make sure   there are no signs of a poor response to cold or skin damage.  Rest 20 minutes or more in between uses.  Once your skin is numb, you can end your treatment. You can test numbness by very lightly touching your skin. The touch should be so light that you do not see the skin dimple from the pressure of your fingertip. When using ice, most people will feel these normal sensations in this order: cold, burning, aching, and numbness.  Do not use ice on someone who cannot  communicate their responses to pain, such as small children or people with dementia. HOW TO MAKE AN ICE PACK Ice packs are the most common way to use ice therapy. Other methods include ice massage, ice baths, and cryo-sprays. Muscle creams that cause a cold, tingly feeling do not offer the same benefits that ice offers and should not be used as a substitute unless recommended by your caregiver. To make an ice pack, do one of the following:  Place crushed ice or a bag of frozen vegetables in a sealable plastic bag. Squeeze out the excess air. Place this bag inside another plastic bag. Slide the bag into a pillowcase or place a damp towel between your skin and the bag.  Mix 3 parts water with 1 part rubbing alcohol. Freeze the mixture in a sealable plastic bag. When you remove the mixture from the freezer, it will be slushy. Squeeze out the excess air. Place this bag inside another plastic bag. Slide the bag into a pillowcase or place a damp towel between your skin and the bag. SEEK MEDICAL CARE IF:  You develop white spots on your skin. This may give the skin a blotchy (mottled) appearance.  Your skin turns blue or pale.  Your skin becomes waxy or hard.  Your swelling gets worse. MAKE SURE YOU:   Understand these instructions.  Will watch your condition.  Will get help right away if you are not doing well or get worse. Document Released: 10/03/2010 Document Revised: 05/01/2011 Document Reviewed: 10/03/2010 [facility] Patient Information 2014 [place], [place]. Your x-ray is negative for fracture

## 2013-06-13 NOTE — ED Provider Notes (Signed)
Medical screening examination/treatment/procedure(s) were performed by non-physician practitioner and as supervising physician I was immediately available for consultation/collaboration.   EKG Interpretation None       Merryl Hacker, MD 06/13/13 1433

## 2013-12-22 ENCOUNTER — Encounter (HOSPITAL_COMMUNITY): Payer: Self-pay | Admitting: Emergency Medicine

## 2014-02-10 ENCOUNTER — Other Ambulatory Visit: Payer: Self-pay | Admitting: Family Medicine

## 2014-02-23 ENCOUNTER — Encounter: Payer: Self-pay | Admitting: Family Medicine

## 2014-02-23 ENCOUNTER — Ambulatory Visit (INDEPENDENT_AMBULATORY_CARE_PROVIDER_SITE_OTHER): Payer: Medicare Other

## 2014-02-23 ENCOUNTER — Ambulatory Visit (INDEPENDENT_AMBULATORY_CARE_PROVIDER_SITE_OTHER): Payer: Medicare Other | Admitting: Family Medicine

## 2014-02-23 VITALS — BP 130/80 | HR 76 | Temp 98.4°F | Wt 143.0 lb

## 2014-02-23 DIAGNOSIS — E039 Hypothyroidism, unspecified: Secondary | ICD-10-CM

## 2014-02-23 DIAGNOSIS — H10023 Other mucopurulent conjunctivitis, bilateral: Secondary | ICD-10-CM

## 2014-02-23 DIAGNOSIS — Z23 Encounter for immunization: Secondary | ICD-10-CM | POA: Diagnosis not present

## 2014-02-23 DIAGNOSIS — J209 Acute bronchitis, unspecified: Secondary | ICD-10-CM | POA: Diagnosis not present

## 2014-02-23 LAB — TSH: TSH: 1.12 u[IU]/mL (ref 0.35–4.50)

## 2014-02-23 MED ORDER — POLYMYXIN B-TRIMETHOPRIM 10000-0.1 UNIT/ML-% OP SOLN: 2.0000 [drp] | OPHTHALMIC | Status: DC

## 2014-02-23 MED ORDER — HYDROCODONE-HOMATROPINE 5-1.5 MG/5ML PO SYRP: 5.0000 mL | ORAL_SOLUTION | Freq: Four times a day (QID) | ORAL | Status: AC | PRN

## 2014-02-23 NOTE — Progress Notes (Signed)
Pre visit review using our clinic review tool, if applicable. No additional management support is needed unless otherwise documented below in the visit note. 

## 2014-02-23 NOTE — Progress Notes (Signed)
   Subjective:    Patient ID: Taylor Kelley, female    DOB: 27-Aug-1940, 74 y.o.   MRN: [id number]  HPI Acute visit. Patient is seen with one-week history of cough and nasal congestion. She also had some sore throat initially. No fever. No headache. Increased malaise. Denies any nausea, vomiting, or diarrhea. Cough is especially bothersome at night. Nonsmoker. No wheezing. No dyspnea.  She has also had some redness of the left eye with crusted drainage for the past 1 day. No blurred vision. No eye pain. Starting to develop similar symptoms in the right eye. No sick exposures. No chronic sinusitis symptoms.  She has hypothyroidism and is on Armour Thyroid. Levels not checked in about one year.  Reviewed with no changes;  Past Medical History  Diagnosis Date  . GERD (gastroesophageal reflux disease)   . Allergy   . Heart murmur   . Thyroid disease   . Urine incontinence   . UTI (urinary tract infection)   . Osteopenia  04/2013    T score -1.7 FRAX 11%/2.1%   Past Surgical History  Procedure Laterality Date  . Endometrial biopsy  2009    polyp    reports that she quit smoking about 35 years ago. Her smoking use included Cigarettes. She has a 10 pack-year smoking history. She does not have any smokeless tobacco history on file. She reports that she drinks about 3.5 oz of alcohol per week. She reports that she does not use illicit drugs. family history includes Breast cancer (age of onset: 37) in her other; Breast cancer (age of onset: 88) in her sister; Cancer in her paternal grandfather; Heart disease in her father and mother; Hypertension in her mother; Stroke in her mother. No Known Allergies    Review of Systems  Constitutional: Positive for fatigue. Negative for fever and chills.  HENT: Positive for congestion.   Eyes: Positive for discharge and redness. Negative for photophobia, pain and visual disturbance.  Respiratory: Positive for cough. Negative for shortness of breath and  wheezing.   Neurological: Negative for headaches.       Objective:   Physical Exam  Constitutional: She appears well-developed and well-nourished.  HENT:  Right Ear: External ear normal.  Left Ear: External ear normal.  Mouth/Throat: Oropharynx is clear and moist.  Eyes:  Left conjunctiva slightly erythematous. Right conjunctiva appears normal. Otherwise, eye exam normal  Neck: Neck supple.  Cardiovascular: Normal rate and regular rhythm.   Pulmonary/Chest: Effort normal and breath sounds normal. No respiratory distress. She has no wheezes. She has no rales.  Lymphadenopathy:    She has no cervical adenopathy.  Neurological: She is alert.          Assessment & Plan:  #1 acute viral upper respiratory infection. Reassurance. Hycodan cough syrup 1 teaspoon daily at bedtime when necessary for cough #2 bilateral conjunctivitis, probably bacterial. Warm compresses. Polytrim ophthalmic drops 2 drops both eyes every 4 hours while awake #3 health maintenance. Flu vaccine given. We elected to go ahead and give since she does not have any fever and we are getting this late into the season #4 hypothyroidism. Recheck TSH which is overdue

## 2014-02-23 NOTE — Patient Instructions (Signed)
Acute Bronchitis Bronchitis is inflammation of the airways that extend from the windpipe into the lungs (bronchi). The inflammation often causes mucus to develop. This leads to a cough, which is the most common symptom of bronchitis.  In acute bronchitis, the condition usually develops suddenly and goes away over time, usually in a couple weeks. Smoking, allergies, and asthma can make bronchitis worse. Repeated episodes of bronchitis may cause further lung problems.  CAUSES Acute bronchitis is most often caused by the same virus that causes a cold. The virus can spread from person to person (contagious) through coughing, sneezing, and touching contaminated objects. SIGNS AND SYMPTOMS   Cough.   Fever.   Coughing up mucus.   Body aches.   Chest congestion.   Chills.   Shortness of breath.   Sore throat.  DIAGNOSIS  Acute bronchitis is usually diagnosed through a physical exam. Your health care provider will also ask you questions about your medical history. Tests, such as chest X-rays, are sometimes done to rule out other conditions.  TREATMENT  Acute bronchitis usually goes away in a couple weeks. Oftentimes, no medical treatment is necessary. Medicines are sometimes given for relief of fever or cough. Antibiotic medicines are usually not needed but may be prescribed in certain situations. In some cases, an inhaler may be recommended to help reduce shortness of breath and control the cough. A cool mist vaporizer may also be used to help thin bronchial secretions and make it easier to clear the chest.  HOME CARE INSTRUCTIONS  Get plenty of rest.   Drink enough fluids to keep your urine clear or pale yellow (unless you have a medical condition that requires fluid restriction). Increasing fluids may help thin your respiratory secretions (sputum) and reduce chest congestion, and it will prevent dehydration.   Take medicines only as directed by your health care provider.  If  you were prescribed an antibiotic medicine, finish it all even if you start to feel better.  Avoid smoking and secondhand smoke. Exposure to cigarette smoke or irritating chemicals will make bronchitis worse. If you are a smoker, consider using nicotine gum or skin patches to help control withdrawal symptoms. Quitting smoking will help your lungs heal faster.   Reduce the chances of another bout of acute bronchitis by washing your hands frequently, avoiding people with cold symptoms, and trying not to touch your hands to your mouth, nose, or eyes.   Keep all follow-up visits as directed by your health care provider.  SEEK MEDICAL CARE IF: Your symptoms do not improve after 1 week of treatment.  SEEK IMMEDIATE MEDICAL CARE IF:  You develop an increased fever or chills.   You have chest pain.   You have severe shortness of breath.  You have bloody sputum.   You develop dehydration.  You faint or repeatedly feel like you are going to pass out.  You develop repeated vomiting.  You develop a severe headache. MAKE SURE YOU:   Understand these instructions.  Will watch your condition.  Will get help right away if you are not doing well or get worse. Document Released: 03/16/2004 Document Revised: 06/23/2013 Document Reviewed: 07/30/2012 [facility] Patient Information 2015 [place], [place]. This information is not intended to replace advice given to you by your health care provider. Make sure you discuss any questions you have with your health care provider. Bacterial Conjunctivitis Bacterial conjunctivitis, commonly called pink eye, is an inflammation of the clear membrane that covers the white part of the eye (conjunctiva).  The inflammation can also happen on the underside of the eyelids. The blood vessels in the conjunctiva become inflamed, causing the eye to become red or pink. Bacterial conjunctivitis may spread easily from one eye to another and from person to person  (contagious).  CAUSES  Bacterial conjunctivitis is caused by bacteria. The bacteria may come from your own skin, your upper respiratory tract, or from someone else with bacterial conjunctivitis. SYMPTOMS  The normally white color of the eye or the underside of the eyelid is usually pink or red. The pink eye is usually associated with irritation, tearing, and some sensitivity to light. Bacterial conjunctivitis is often associated with a thick, yellowish discharge from the eye. The discharge may turn into a crust on the eyelids overnight, which causes your eyelids to stick together. If a discharge is present, there may also be some blurred vision in the affected eye. DIAGNOSIS  Bacterial conjunctivitis is diagnosed by your caregiver through an eye exam and the symptoms that you report. Your caregiver looks for changes in the surface tissues of your eyes, which may point to the specific type of conjunctivitis. A sample of any discharge may be collected on a cotton-tip swab if you have a severe case of conjunctivitis, if your cornea is affected, or if you keep getting repeat infections that do not respond to treatment. The sample will be sent to a lab to see if the inflammation is caused by a bacterial infection and to see if the infection will respond to antibiotic medicines. TREATMENT   Bacterial conjunctivitis is treated with antibiotics. Antibiotic eyedrops are most often used. However, antibiotic ointments are also available. Antibiotics pills are sometimes used. Artificial tears or eye washes may ease discomfort. HOME CARE INSTRUCTIONS   To ease discomfort, apply a cool, clean washcloth to your eye for 10-20 minutes, 3-4 times a day.  Gently wipe away any drainage from your eye with a warm, wet washcloth or a cotton ball.  Wash your hands often with soap and water. Use paper towels to dry your hands.  Do not share towels or washcloths. This may spread the infection.  Change or wash your  pillowcase every day.  You should not use eye makeup until the infection is gone.  Do not operate machinery or drive if your vision is blurred.  Stop using contact lenses. Ask your caregiver how to sterilize or replace your contacts before using them again. This depends on the type of contact lenses that you use.  When applying medicine to the infected eye, do not touch the edge of your eyelid with the eyedrop bottle or ointment tube. SEEK IMMEDIATE MEDICAL CARE IF:   Your infection has not improved within 3 days after beginning treatment.  You had yellow discharge from your eye and it returns.  You have increased eye pain.  Your eye redness is spreading.  Your vision becomes blurred.  You have a fever or persistent symptoms for more than 2-3 days.  You have a fever and your symptoms suddenly get worse.  You have facial pain, redness, or swelling. MAKE SURE YOU:   Understand these instructions.  Will watch your condition.  Will get help right away if you are not doing well or get worse. Document Released: 02/06/2005 Document Revised: 06/23/2013 Document Reviewed: 07/10/2011 [facility] Patient Information 2015 [place], [place]. This information is not intended to replace advice given to you by your health care provider. Make sure you discuss any questions you have with your health care provider.

## 2014-02-24 ENCOUNTER — Telehealth: Payer: Self-pay | Admitting: Family Medicine

## 2014-02-24 NOTE — Telephone Encounter (Signed)
Pt request refill of the following: thyroid (ARMOUR) 60 MG tablet,  traZODone (DESYREL) 150 MG tablet levocetirizine (XYZAL) 5 MG tablet,   Pt called to say the above meds was suppose to be called in and she checked with pharmacy and they don't any rx's   Phamacy:  [facility] at [address]

## 2014-02-25 MED ORDER — TRAZODONE HCL 150 MG PO TABS: 150.0000 mg | ORAL_TABLET | Freq: Every day | ORAL | Status: DC

## 2014-02-25 MED ORDER — THYROID 60 MG PO TABS: 60.0000 mg | ORAL_TABLET | Freq: Every day | ORAL | Status: DC

## 2014-02-25 MED ORDER — LEVOCETIRIZINE DIHYDROCHLORIDE 5 MG PO TABS: 5.0000 mg | ORAL_TABLET | Freq: Every evening | ORAL | Status: DC

## 2014-02-25 NOTE — Telephone Encounter (Signed)
Rx sent to pharmacy   

## 2014-02-27 ENCOUNTER — Telehealth: Payer: Self-pay | Admitting: Family Medicine

## 2014-02-27 MED ORDER — TOBRAMYCIN 0.3 % OP SOLN: 2.0000 [drp] | Freq: Four times a day (QID) | OPHTHALMIC | Status: DC

## 2014-02-27 NOTE — Telephone Encounter (Signed)
Pt aware and Rx sent to pharmacy

## 2014-02-27 NOTE — Telephone Encounter (Signed)
If she still has any crusted drainage, change to Tobrex eye drops 2 drops in each eye QID 5 ml and needs ophthalmology assessment if not better after that.

## 2014-02-27 NOTE — Telephone Encounter (Signed)
Pt was seen on 1-4 for pink eye and eye is still itchy and watery and no better. [facility]. Please advise

## 2014-07-23 ENCOUNTER — Telehealth: Payer: Self-pay | Admitting: *Deleted

## 2014-07-23 NOTE — Telephone Encounter (Signed)
My chart message sent about Mammogram.

## 2014-08-06 ENCOUNTER — Telehealth: Payer: Self-pay

## 2014-08-06 NOTE — Telephone Encounter (Signed)
Left message for patient to return call.

## 2014-08-06 NOTE — Telephone Encounter (Signed)
-----   Message from Marian Sorrow, LPN sent at [id number]/93/8101  1:20 PM EDT ----- I sent a My Chart message to pt regarding Mammogram due. Pt has not read my message. Please call pt.   Thanks, Butch Penny

## 2014-09-07 DIAGNOSIS — S92501A Displaced unspecified fracture of right lesser toe(s), initial encounter for closed fracture: Secondary | ICD-10-CM | POA: Diagnosis not present

## 2014-09-07 DIAGNOSIS — M79674 Pain in right toe(s): Secondary | ICD-10-CM | POA: Diagnosis not present

## 2014-10-05 DIAGNOSIS — S92501D Displaced unspecified fracture of right lesser toe(s), subsequent encounter for fracture with routine healing: Secondary | ICD-10-CM | POA: Diagnosis not present

## 2015-01-11 ENCOUNTER — Ambulatory Visit (INDEPENDENT_AMBULATORY_CARE_PROVIDER_SITE_OTHER): Payer: Medicare Other | Admitting: Family Medicine

## 2015-01-11 ENCOUNTER — Encounter: Payer: Self-pay | Admitting: Family Medicine

## 2015-01-11 VITALS — BP 110/72 | HR 90 | Temp 98.1°F | Ht 68.0 in | Wt 146.7 lb

## 2015-01-11 DIAGNOSIS — J069 Acute upper respiratory infection, unspecified: Secondary | ICD-10-CM

## 2015-01-11 DIAGNOSIS — R300 Dysuria: Secondary | ICD-10-CM

## 2015-01-11 DIAGNOSIS — Z23 Encounter for immunization: Secondary | ICD-10-CM | POA: Diagnosis not present

## 2015-01-11 LAB — POCT URINALYSIS DIPSTICK
Bilirubin, UA: NEGATIVE
GLUCOSE UA: NEGATIVE
Nitrite, UA: NEGATIVE
PROTEIN UA: NEGATIVE
RBC UA: NEGATIVE
Spec Grav, UA: 1.02
UROBILINOGEN UA: 0.2
pH, UA: 6

## 2015-01-11 NOTE — Addendum Note (Signed)
Addended by: Agnes Lawrence on: 01/11/2015 04:21 PM   Modules accepted: Orders

## 2015-01-11 NOTE — Progress Notes (Signed)
Pre visit review using our clinic review tool, if applicable. No additional management support is needed unless otherwise documented below in the visit note. 

## 2015-01-11 NOTE — Patient Instructions (Addendum)
Do you want your flu shot?  INSTRUCTIONS FOR UPPER RESPIRATORY INFECTION:  -plenty of rest and fluids  -nasal saline wash 2-3 times daily (use prepackaged nasal saline or bottled/distilled water if making your own)   -can use AFRIN nasal spray for drainage and nasal congestion - but do NOT use longer then 3-4 days  -can use tylenol (in no history of liver disease) or ibuprofen (if no history of kidney disease, bowel bleeding or significant heart disease) as directed for aches and sorethroat  -in the winter time, using a humidifier at night is helpful (please follow cleaning instructions)  -if you are taking a cough medication - use only as directed, may also try a teaspoon of honey to coat the throat and throat lozenges. If given a cough medication with codeine or hydrocodone or other narcotic please be advised that this contains a strong and  potentially addicting medication. Please follow instructions carefully, take as little as possible and only use AS NEEDED for severe cough. Discuss potential side effects with your pharmacy. Please do not drive or operate machinery while taking these types of medications. Please do not take other sedating medications, drugs or alcohol while taking this medication without discussing with your doctor.  -for sore throat, salt water gargles can help  -follow up if you have fevers, facial pain, tooth pain, difficulty breathing or are worsening or symptoms persist longer then expected  Upper Respiratory Infection, Adult An upper respiratory infection (URI) is also known as the common cold. It is often caused by a type of germ (virus). Colds are easily spread (contagious). You can pass it to others by kissing, coughing, sneezing, or drinking out of the same glass. Usually, you get better in 1 to 3  weeks.  However, the cough can last for even longer. HOME CARE   Only take medicine as told by your doctor. Follow instructions provided above.  Drink enough  water and fluids to keep your pee (urine) clear or pale yellow.  Get plenty of rest.  Return to work when your temperature is < 100 for 24 hours or as told by your doctor. You may use a face mask and wash your hands to stop your cold from spreading. GET HELP RIGHT AWAY IF:   After the first few days, you feel you are getting worse.  You have questions about your medicine.  You have chills, shortness of breath, or red spit (mucus).  You have pain in the face for more then 1-2 days, especially when you bend forward.  You have a fever, puffy (swollen) neck, pain when you swallow, or white spots in the back of your throat.  You have a bad headache, ear pain, sinus pain, or chest pain.  You have a high-pitched whistling sound when you breathe in and out (wheezing).  You cough up blood.  You have sore muscles or a stiff neck. MAKE SURE YOU:   Understand these instructions.  Will watch your condition.  Will get help right away if you are not doing well or get worse. Document Released: 07/26/2007 Document Revised: 05/01/2011 Document Reviewed: 05/14/2013 [facility] Patient Information 2015 [place], [place]. This information is not intended to replace advice given to you by your health care provider. Make sure you discuss any questions you have with your health care provider.

## 2015-01-11 NOTE — Progress Notes (Addendum)
HPI:   URI: -started: 2 days ago -symptoms: clearnasal congestion, sore throat, cough -denies:fever, SOB, NVD, tooth pain -has tried: benadryl -sick contacts/travel/risks: denies flu exposure, tick exposure or or Ebola risks, husband with a cold for 1.5 weeks ROS: See pertinent positives and negatives per HPI.  Past Medical History  Diagnosis Date  . GERD (gastroesophageal reflux disease)   . Allergy   . Heart murmur   . Thyroid disease   . Urine incontinence   . UTI (urinary tract infection)   . Osteopenia  04/2013    T score -1.7 FRAX 11%/2.1%    Past Surgical History  Procedure Laterality Date  . Endometrial biopsy  2009    polyp    Family History  Problem Relation Age of Onset  . Heart disease Mother   . Stroke Mother   . Hypertension Mother   . Heart disease Father   . Breast cancer Sister 31  . Cancer Paternal Grandfather   . Breast cancer Other 67    Social History   Social History  . Marital Status: Married    Spouse Name: N/A  . Number of Children: N/A  . Years of Education: N/A   Social History Main Topics  . Smoking status: Former Smoker -- 0.50 packs/day for 20 years    Types: Cigarettes    Quit date: 06/14/1978  . Smokeless tobacco: None  . Alcohol Use: 3.5 oz/week    7 drink(s) per week  . Drug Use: No  . Sexual Activity: Yes    Birth Control/ Protection: Post-menopausal   Other Topics Concern  . None   Social History Narrative     Current outpatient prescriptions:  .  ascorbic acid (VITAMIN C) 500 MG tablet, Take 500 mg by mouth daily., Disp: , Rfl:  .  aspirin 81 MG chewable tablet, Chew 81 mg by mouth daily., Disp: , Rfl:  .  calcium-vitamin D (OSCAL WITH D) 500-200 MG-UNIT per tablet, Take 1 tablet by mouth daily with breakfast., Disp: , Rfl:  .  Cholecalciferol 1000 UNITS tablet, Take 1,000 Units by mouth daily., Disp: , Rfl:  .  lansoprazole (PREVACID) 15 MG capsule, Take 15 mg by mouth daily at 12 noon., Disp: , Rfl:  .   levocetirizine (XYZAL) 5 MG tablet, Take 1 tablet (5 mg total) by mouth every evening., Disp: 90 tablet, Rfl: 3 .  Multiple Vitamins-Minerals (HAIR/SKIN/NAILS PO), Take 1 tablet by mouth daily., Disp: , Rfl:  .  Probiotic Product (PHILLIPS COLON HEALTH PO), Take 1 capsule by mouth daily., Disp: , Rfl:  .  thyroid (ARMOUR) 60 MG tablet, Take 1 tablet (60 mg total) by mouth daily., Disp: 90 tablet, Rfl: 3 .  tobramycin (TOBREX) 0.3 % ophthalmic solution, Place 2 drops into both eyes 4 (four) times daily., Disp: 5 mL, Rfl: 0 .  traZODone (DESYREL) 150 MG tablet, Take 1 tablet (150 mg total) by mouth at bedtime., Disp: 90 tablet, Rfl: 3 .  trimethoprim-polymyxin b (POLYTRIM) ophthalmic solution, Place 2 drops into both eyes every 4 (four) hours., Disp: 10 mL, Rfl: 0 .  vitamin B-12 (CYANOCOBALAMIN) 1000 MCG tablet, Take 1,000 mcg by mouth daily., Disp: , Rfl:  .  vitamin E (VITAMIN E) 400 UNIT capsule, Take 400 Units by mouth daily., Disp: , Rfl:   EXAM:  Filed Vitals:   01/11/15 1538  BP: 110/72  Pulse: 90  Temp: 98.1 F (36.7 C)    Body mass index is 22.31 kg/(m^2).  GENERAL: vitals  reviewed and listed above, alert, oriented, appears well hydrated and in no acute distress  HEENT: atraumatic, conjunttiva clear, no obvious abnormalities on inspection of external nose and ears, normal appearance of ear canals and TMs, clear nasal congestion, mild post oropharyngeal erythema with PND, no tonsillar edema or exudate, no sinus TTP  NECK: no obvious masses on inspection  LUNGS: clear to auscultation bilaterally, no wheezes, rales or rhonchi, good air movement  CV: HRRR, no peripheral edema  MS: moves all extremities without noticeable abnormality  PSYCH: pleasant and cooperative, no obvious depression or anxiety  ASSESSMENT AND PLAN:  Discussed the following assessment and plan:  Acute upper respiratory infection  -given HPI and exam findings today, a serious infection or illness is  unlikely. We discussed potential etiologies, with VURI being most likely, and advised supportive care and monitoring. We discussed treatment side effects, likely course, antibiotic misuse, transmission, and signs of developing a serious illness. -of course, we advised to return or notify a doctor immediately if symptoms worsen or persist or new concerns arise.  ADDENDUM: as she was leaving she asked if we could check a urine. Reports sl dysuria for several days. No abd or pelvic pain, NVD, hematuria, fevers, malaise, flank pain. No CVA TTP on brief exam. Udip and culture pending.  Patient Instructions  Do you want your flu shot?  INSTRUCTIONS FOR UPPER RESPIRATORY INFECTION:  -plenty of rest and fluids  -nasal saline wash 2-3 times daily (use prepackaged nasal saline or bottled/distilled water if making your own)   -can use AFRIN nasal spray for drainage and nasal congestion - but do NOT use longer then 3-4 days  -can use tylenol (in no history of liver disease) or ibuprofen (if no history of kidney disease, bowel bleeding or significant heart disease) as directed for aches and sorethroat  -in the winter time, using a humidifier at night is helpful (please follow cleaning instructions)  -if you are taking a cough medication - use only as directed, may also try a teaspoon of honey to coat the throat and throat lozenges. If given a cough medication with codeine or hydrocodone or other narcotic please be advised that this contains a strong and  potentially addicting medication. Please follow instructions carefully, take as little as possible and only use AS NEEDED for severe cough. Discuss potential side effects with your pharmacy. Please do not drive or operate machinery while taking these types of medications. Please do not take other sedating medications, drugs or alcohol while taking this medication without discussing with your doctor.  -for sore throat, salt water gargles can help  -follow  up if you have fevers, facial pain, tooth pain, difficulty breathing or are worsening or symptoms persist longer then expected  Upper Respiratory Infection, Adult An upper respiratory infection (URI) is also known as the common cold. It is often caused by a type of germ (virus). Colds are easily spread (contagious). You can pass it to others by kissing, coughing, sneezing, or drinking out of the same glass. Usually, you get better in 1 to 3  weeks.  However, the cough can last for even longer. HOME CARE   Only take medicine as told by your doctor. Follow instructions provided above.  Drink enough water and fluids to keep your pee (urine) clear or pale yellow.  Get plenty of rest.  Return to work when your temperature is < 100 for 24 hours or as told by your doctor. You may use a face mask and wash  your hands to stop your cold from spreading. GET HELP RIGHT AWAY IF:   After the first few days, you feel you are getting worse.  You have questions about your medicine.  You have chills, shortness of breath, or red spit (mucus).  You have pain in the face for more then 1-2 days, especially when you bend forward.  You have a fever, puffy (swollen) neck, pain when you swallow, or white spots in the back of your throat.  You have a bad headache, ear pain, sinus pain, or chest pain.  You have a high-pitched whistling sound when you breathe in and out (wheezing).  You cough up blood.  You have sore muscles or a stiff neck. MAKE SURE YOU:   Understand these instructions.  Will watch your condition.  Will get help right away if you are not doing well or get worse. Document Released: 07/26/2007 Document Revised: 05/01/2011 Document Reviewed: 05/14/2013 [facility] Patient Information 2015 [place], [place]. This information is not intended to replace advice given to you by your health care provider. Make sure you discuss any questions you have with your health care provider.      Colin Benton R.

## 2015-01-13 MED ORDER — NITROFURANTOIN MONOHYD MACRO 100 MG PO CAPS: 100.0000 mg | ORAL_CAPSULE | Freq: Two times a day (BID) | ORAL | Status: DC

## 2015-01-13 NOTE — Addendum Note (Signed)
Addended by: Agnes Lawrence on: 01/13/2015 08:47 AM   Modules accepted: Orders

## 2015-01-14 LAB — URINE CULTURE: Colony Count: >=100000

## 2015-02-28 ENCOUNTER — Other Ambulatory Visit: Payer: Self-pay | Admitting: Family Medicine

## 2015-03-02 ENCOUNTER — Other Ambulatory Visit: Payer: Self-pay | Admitting: Family Medicine

## 2015-03-02 NOTE — Telephone Encounter (Signed)
Patient not seen since 02/23/14 and no pending appointments. Okay to refill?

## 2015-03-02 NOTE — Telephone Encounter (Signed)
Pt was last seen 01/11/15 by Dr. Maudie Mercury. Patient has no upcoming appts - ok to refill this medication?

## 2015-03-02 NOTE — Telephone Encounter (Signed)
Refill each for 3 months, but she is due for repeat labs Make sure she has appt within 1-2 months.

## 2015-04-06 ENCOUNTER — Ambulatory Visit (INDEPENDENT_AMBULATORY_CARE_PROVIDER_SITE_OTHER): Payer: Medicare Other | Admitting: Family Medicine

## 2015-04-06 VITALS — BP 140/80 | HR 102 | Temp 98.4°F | Ht 68.0 in | Wt 147.8 lb

## 2015-04-06 DIAGNOSIS — R8290 Unspecified abnormal findings in urine: Secondary | ICD-10-CM | POA: Diagnosis not present

## 2015-04-06 DIAGNOSIS — R8299 Other abnormal findings in urine: Secondary | ICD-10-CM | POA: Diagnosis not present

## 2015-04-06 DIAGNOSIS — E039 Hypothyroidism, unspecified: Secondary | ICD-10-CM

## 2015-04-06 LAB — POCT URINALYSIS DIPSTICK
BILIRUBIN UA: NEGATIVE
Glucose, UA: NEGATIVE
Ketones, UA: NEGATIVE
NITRITE UA: POSITIVE
PH UA: 5.5
PROTEIN UA: NEGATIVE
Spec Grav, UA: 1.025
Urobilinogen, UA: 0.2

## 2015-04-06 MED ORDER — NITROFURANTOIN MONOHYD MACRO 100 MG PO CAPS: 100.0000 mg | ORAL_CAPSULE | Freq: Two times a day (BID) | ORAL | Status: DC

## 2015-04-06 NOTE — Progress Notes (Signed)
   Subjective:    Patient ID: Taylor Kelley, female    DOB: 23-Apr-1940, 75 y.o.   MRN: [id number]  HPI Patient seen for the following issues  New acute issue of three-week history of increased odor with urine She has noticed occasional cloudy appearance. No urine frequency or burning. Denies any fever or chills. No abdominal pain. No nausea or vomiting. She had positive urine culture with Klebsiella back in November and symptoms resolved with Macrobid  History of hypothyroidism. For several years she has taken Armour thyroid. She states that she was tried on Synthroid years ago but had increased heart rate around 150-after taking just a couple doses. She is very reluctant take any other doses than Armour Last TSH was normal and this is just over a year ago. She is overdue for labs and plans to schedule physical soon She is requesting a letter for prior authorization for Armour thyroid  Past Medical History  Diagnosis Date  . GERD (gastroesophageal reflux disease)   . Allergy   . Heart murmur   . Thyroid disease   . Urine incontinence   . UTI (urinary tract infection)   . Osteopenia  04/2013    T score -1.7 FRAX 11%/2.1%   Past Surgical History  Procedure Laterality Date  . Endometrial biopsy  2009    polyp    reports that she quit smoking about 36 years ago. Her smoking use included Cigarettes. She has a 10 pack-year smoking history. She does not have any smokeless tobacco history on file. She reports that she drinks about 3.5 oz of alcohol per week. She reports that she does not use illicit drugs. family history includes Breast cancer (age of onset: 33) in her other; Breast cancer (age of onset: 69) in her sister; Cancer in her paternal grandfather; Heart disease in her father and mother; Hypertension in her mother; Stroke in her mother. No Known Allergies    Review of Systems  Constitutional: Positive for fatigue. Negative for fever, chills and appetite change.    Respiratory: Negative for shortness of breath.   Gastrointestinal: Negative for nausea, vomiting, abdominal pain, diarrhea and constipation.  Genitourinary: Positive for dysuria and frequency.  Musculoskeletal: Negative for back pain.  Neurological: Negative for dizziness and weakness.  Psychiatric/Behavioral: Negative for confusion.       Objective:   Physical Exam  Constitutional: She appears well-developed and well-nourished.  Neck: Neck supple. No thyromegaly present.  Cardiovascular: Normal rate and regular rhythm.  Exam reveals no gallop.   Pulmonary/Chest: Effort normal and breath sounds normal. No respiratory distress. She has no wheezes. She has no rales.  Musculoskeletal: She exhibits no edema.  Neurological: She is alert.          Assessment & Plan:  #1 dysuria. Suspect UTI based on urine dipstick. Urine culture sent. Macrobid 1 twice a day for 7 days pending culture results  #2 hypothyroidism. Patient will schedule for physical and obtain lab work then. Letter of prior authorization sent for Armour Thyroid. If this is denied, would give trial of levothyroxine

## 2015-04-06 NOTE — Patient Instructions (Signed)

## 2015-04-06 NOTE — Progress Notes (Signed)
Pre visit review using our clinic review tool, if applicable. No additional management support is needed unless otherwise documented below in the visit note. 

## 2015-04-07 ENCOUNTER — Other Ambulatory Visit: Payer: Self-pay | Admitting: Family Medicine

## 2015-04-07 ENCOUNTER — Ambulatory Visit: Payer: Medicare Other | Admitting: Family Medicine

## 2015-04-09 ENCOUNTER — Telehealth: Payer: Self-pay | Admitting: *Deleted

## 2015-04-09 LAB — URINE CULTURE

## 2015-04-09 NOTE — Telephone Encounter (Signed)
Appeals letter has been completed and up front for pick up. Pt is aware.

## 2015-04-09 NOTE — Telephone Encounter (Signed)
Patient's insurance will no longer cover ARMOUR THYROID 60 MG tablet.  An alternative medication includes Synthroid.  Please advise.

## 2015-04-28 ENCOUNTER — Encounter: Payer: Self-pay | Admitting: Family Medicine

## 2015-04-28 ENCOUNTER — Ambulatory Visit (INDEPENDENT_AMBULATORY_CARE_PROVIDER_SITE_OTHER): Payer: Medicare Other | Admitting: Family Medicine

## 2015-04-28 VITALS — BP 122/82 | HR 78 | Temp 98.2°F | Ht 68.0 in | Wt 148.2 lb

## 2015-04-28 DIAGNOSIS — F5104 Psychophysiologic insomnia: Secondary | ICD-10-CM

## 2015-04-28 DIAGNOSIS — Z23 Encounter for immunization: Secondary | ICD-10-CM

## 2015-04-28 DIAGNOSIS — G4700 Insomnia, unspecified: Secondary | ICD-10-CM | POA: Diagnosis not present

## 2015-04-28 DIAGNOSIS — Z0000 Encounter for general adult medical examination without abnormal findings: Secondary | ICD-10-CM | POA: Diagnosis not present

## 2015-04-28 DIAGNOSIS — K219 Gastro-esophageal reflux disease without esophagitis: Secondary | ICD-10-CM | POA: Diagnosis not present

## 2015-04-28 DIAGNOSIS — R252 Cramp and spasm: Secondary | ICD-10-CM | POA: Diagnosis not present

## 2015-04-28 DIAGNOSIS — E039 Hypothyroidism, unspecified: Secondary | ICD-10-CM

## 2015-04-28 LAB — BASIC METABOLIC PANEL
BUN: 16 mg/dL (ref 6–23)
CALCIUM: 9.3 mg/dL (ref 8.4–10.5)
CO2: 31 meq/L (ref 19–32)
Chloride: 101 mEq/L (ref 96–112)
Creatinine, Ser: 0.77 mg/dL (ref 0.40–1.20)
GFR: 77.85 mL/min (ref >60.00)
Glucose, Bld: 92 mg/dL (ref 70–99)
Potassium: 4.1 mEq/L (ref 3.5–5.1)
SODIUM: 140 meq/L (ref 135–145)

## 2015-04-28 LAB — MAGNESIUM: Magnesium: 2 mg/dL (ref 1.5–2.5)

## 2015-04-28 LAB — TSH: TSH: 1.58 u[IU]/mL (ref 0.35–4.50)

## 2015-04-28 LAB — T4, FREE: Free T4: 0.73 ng/dL (ref 0.60–1.60)

## 2015-04-28 LAB — T3, FREE: T3, Free: 3.4 pg/mL (ref 2.3–4.2)

## 2015-04-28 NOTE — Addendum Note (Signed)
Addended by: Elio Forget on: 04/28/2015 10:17 AM   Modules accepted: Orders

## 2015-04-28 NOTE — Progress Notes (Signed)
Subjective:    Patient ID: Taylor Kelley, female    DOB: Aug 14, 1940, 75 y.o.   MRN: [id number]  HPI Patient here for Medicare subsequent annual wellness visit and medical follow-up She still sees gynecologist yearly and plans to set that up this spring. She's not had a mammogram in the past year. She has declined colonoscopy but is willing to consider Cologuard. She walks about 2 miles per day for exercise. Takes regular calcium and vitamin D.  Medical problems include hypothyroidism, GERD, and chronic insomnia. Still has occasional sleep issues with trazodone at night. GERD symptoms are currently controlled with over-the-counter Nexium. No dysphagia. Appetite and weight stable.  Hypothyroidism. She has apparently been intolerant of Synthroid in the past. She takes Armour thyroid but still has fatigue issues frequently.  Compliant with therapy.  Recently bilateral leg cramps. Mostly at night. Stays well-hydrated. Takes regular B12 supplementation. No alleviating or exacerbating factors.  Past Medical History  Diagnosis Date  . GERD (gastroesophageal reflux disease)   . Allergy   . Heart murmur   . Thyroid disease   . Urine incontinence   . UTI (urinary tract infection)   . Osteopenia  04/2013    T score -1.7 FRAX 11%/2.1%   Past Surgical History  Procedure Laterality Date  . Endometrial biopsy  2009    polyp    reports that she quit smoking about 36 years ago. Her smoking use included Cigarettes. She has a 10 pack-year smoking history. She does not have any smokeless tobacco history on file. She reports that she drinks about 3.5 oz of alcohol per week. She reports that she does not use illicit drugs. family history includes Breast cancer (age of onset: 79) in her other; Breast cancer (age of onset: 19) in her sister; Cancer in her paternal grandfather; Heart disease in her father and mother; Hypertension in her mother; Stroke in her mother. No Known Allergies  1.  Risk  factors based on Past Medical , Social, and Family history reviewed and as indicated above with no changes 2.  Limitations in physical activities None.  No recent falls. 3.  Depression/mood No active depression or anxiety issues 4.  Hearing No defiits 5.  ADLs independent in all. 6.  Cognitive function (orientation to time and place, language, writing, speech,memory) no short or long term memory issues.  Language and judgement intact. 7.  Home Safety no issues 8.  Height, weight, and visual acuity.all stable. 9.  Counseling discussed regular weightbearing exercise and continue daily calcium and vitamin D 10. Recommendation of preventive services. Prevnar 13. Recommend cologuard and she will consider. She refuses colonoscopy 11. Labs based on risk factors basic metabolic panel, magnesium level, TSH, free T4 12. Care Plan as above 13. Other Providers Dr Oswald Hillock 14. Written schedule of screening/prevention services given to patient.    Review of Systems  Constitutional: Positive for fatigue. Negative for fever, chills, activity change, appetite change and unexpected weight change.  HENT: Negative for ear pain, hearing loss, sore throat and trouble swallowing.   Eyes: Negative for visual disturbance.  Respiratory: Negative for cough and shortness of breath.   Cardiovascular: Negative for chest pain and palpitations.  Gastrointestinal: Negative for abdominal pain, diarrhea, constipation and blood in stool.  Endocrine: Negative for polydipsia and polyuria.  Genitourinary: Negative for dysuria and hematuria.  Musculoskeletal: Negative for myalgias, back pain and arthralgias.  Skin: Negative for rash.  Neurological: Negative for dizziness, syncope and headaches.  Hematological: Negative for adenopathy.  Psychiatric/Behavioral: Negative for confusion and dysphoric mood.       Objective:   Physical Exam  Constitutional: She is oriented to person, place, and time. She appears  well-developed and well-nourished.  HENT:  Head: Normocephalic and atraumatic.  Eyes: EOM are normal. Pupils are equal, round, and reactive to light.  Neck: Normal range of motion. Neck supple. No thyromegaly present.  Cardiovascular: Normal rate, regular rhythm and normal heart sounds.   No murmur heard. Pulmonary/Chest: Breath sounds normal. No respiratory distress. She has no wheezes. She has no rales.  Abdominal: Soft. Bowel sounds are normal. She exhibits no distension and no mass. There is no tenderness. There is no rebound and no guarding.  Musculoskeletal: Normal range of motion. She exhibits no edema.  Lymphadenopathy:    She has no cervical adenopathy.  Neurological: She is alert and oriented to person, place, and time. She displays normal reflexes. No cranial nerve deficit.  Skin: No rash noted.  Psychiatric: She has a normal mood and affect. Her behavior is normal. Judgment and thought content normal.          Assessment & Plan:  #1 Medicare subsequent annual wellness visit. Recommend Prevnar 13. Recommend consider colonoscopy but she declines. She is willing to consider DNA-based stool test. She is given information and will let us know. She will schedule follow-up with GYN and plans to get mammogram through their office.  #2 hypothyroidism. Recheck TSH.  #3 GERD. Symptomatically controlled with over-the-counter Nexium. Taper off Nexium is much as possible  #4 chronic insomnia. Sleep hygiene discussed. Continue trazodone as needed.  #5 bilateral leg cramps. Stay well-hydrated. Check basic metabolic panel and magnesium level. Consider over-the-counter vitamin B supplement

## 2015-04-28 NOTE — Patient Instructions (Addendum)
Health Maintenance  Topic Date Due  . PNA vac Low Risk Adult (1 of 2 - PCV13) 03/06/2006  . MAMMOGRAM  09/02/2011  . COLONOSCOPY  04/27/2016 (Originally 03/07/1991)  . INFLUENZA VACCINE  09/21/2015  . TETANUS/TDAP  02/03/2020  . DEXA SCAN  Completed  . ZOSTAVAX  Completed   Consider Cologuard and let me know if interested Continue follow up with GYN and consider mammogram.

## 2015-04-28 NOTE — Progress Notes (Signed)
Pre visit review using our clinic review tool, if applicable. No additional management support is needed unless otherwise documented below in the visit note. 

## 2015-05-17 ENCOUNTER — Telehealth: Payer: Self-pay | Admitting: Family Medicine

## 2015-05-17 DIAGNOSIS — N390 Urinary tract infection, site not specified: Secondary | ICD-10-CM

## 2015-05-17 NOTE — Telephone Encounter (Signed)
Pt states she has another UTI. This is the second one in a month. Pt would like to try a different abx this time, as the same one was used the other 2 times, and now it has returned.  [facility] / [address]

## 2015-05-17 NOTE — Telephone Encounter (Signed)
i recommend repeat UA/cx.  Start Cipro 500 mg po bid for 5 days pending cx.

## 2015-05-17 NOTE — Telephone Encounter (Signed)
Should we have pt come in for an appt or drop off urine or urology referral.  Please advise.

## 2015-05-18 ENCOUNTER — Other Ambulatory Visit (INDEPENDENT_AMBULATORY_CARE_PROVIDER_SITE_OTHER): Payer: Medicare Other

## 2015-05-18 DIAGNOSIS — N390 Urinary tract infection, site not specified: Secondary | ICD-10-CM | POA: Diagnosis not present

## 2015-05-18 LAB — POC URINALSYSI DIPSTICK (AUTOMATED)
BILIRUBIN UA: NEGATIVE
Glucose, UA: NEGATIVE
Ketones, UA: NEGATIVE
PH UA: 6
Protein, UA: NEGATIVE
Spec Grav, UA: 1.02
Urobilinogen, UA: 0.2

## 2015-05-18 MED ORDER — CIPROFLOXACIN HCL 500 MG PO TABS: 500.0000 mg | ORAL_TABLET | Freq: Two times a day (BID) | ORAL | Status: DC

## 2015-05-18 NOTE — Telephone Encounter (Signed)
Pending appt today at 9:00 am. Orders entered. Medication sent into the pharmacy.

## 2015-05-20 LAB — URINE CULTURE: Colony Count: >=100000

## 2015-05-23 ENCOUNTER — Encounter: Payer: Self-pay | Admitting: Family Medicine

## 2015-05-24 ENCOUNTER — Other Ambulatory Visit: Payer: Self-pay | Admitting: Family Medicine

## 2015-06-02 ENCOUNTER — Other Ambulatory Visit: Payer: Self-pay

## 2015-06-02 DIAGNOSIS — Z1231 Encounter for screening mammogram for malignant neoplasm of breast: Secondary | ICD-10-CM

## 2015-06-07 ENCOUNTER — Telehealth: Payer: Self-pay | Admitting: Family Medicine

## 2015-06-07 MED ORDER — THYROID 60 MG PO TABS
60.0000 mg | ORAL_TABLET | Freq: Every day | ORAL | Status: DC
Start: 2015-06-07 — End: 2016-04-20

## 2015-06-07 NOTE — Telephone Encounter (Signed)
Pt needs armour thyroid 60 mg #90 not #60 send to [facility]

## 2015-06-07 NOTE — Telephone Encounter (Signed)
Medication sent in for patient. 

## 2015-06-15 DIAGNOSIS — M1812 Unilateral primary osteoarthritis of first carpometacarpal joint, left hand: Secondary | ICD-10-CM | POA: Diagnosis not present

## 2015-06-15 DIAGNOSIS — M65312 Trigger thumb, left thumb: Secondary | ICD-10-CM | POA: Diagnosis not present

## 2015-06-23 ENCOUNTER — Ambulatory Visit
Admission: RE | Admit: 2015-06-23 | Discharge: 2015-06-23 | Disposition: A | Discharge status: Home / Self Care | Payer: Medicare Other | Source: Ambulatory Visit

## 2015-06-23 DIAGNOSIS — Z1231 Encounter for screening mammogram for malignant neoplasm of breast: Secondary | ICD-10-CM

## 2015-06-25 ENCOUNTER — Other Ambulatory Visit: Payer: Self-pay | Admitting: Family Medicine

## 2015-06-25 DIAGNOSIS — R928 Other abnormal and inconclusive findings on diagnostic imaging of breast: Secondary | ICD-10-CM

## 2015-07-02 ENCOUNTER — Other Ambulatory Visit: Payer: Self-pay | Admitting: Family Medicine

## 2015-07-02 ENCOUNTER — Ambulatory Visit
Admission: RE | Admit: 2015-07-02 | Discharge: 2015-07-02 | Disposition: A | Discharge status: Home / Self Care | Payer: Medicare Other | Source: Ambulatory Visit | Attending: Family Medicine | Admitting: Family Medicine

## 2015-07-02 DIAGNOSIS — R928 Other abnormal and inconclusive findings on diagnostic imaging of breast: Secondary | ICD-10-CM

## 2015-07-02 DIAGNOSIS — R921 Mammographic calcification found on diagnostic imaging of breast: Secondary | ICD-10-CM | POA: Diagnosis not present

## 2015-07-07 ENCOUNTER — Ambulatory Visit
Admission: RE | Admit: 2015-07-07 | Discharge: 2015-07-07 | Disposition: A | Discharge status: Home / Self Care | Payer: Medicare Other | Source: Ambulatory Visit | Attending: Family Medicine | Admitting: Family Medicine

## 2015-07-07 ENCOUNTER — Other Ambulatory Visit: Payer: Self-pay | Admitting: Family Medicine

## 2015-07-07 DIAGNOSIS — D242 Benign neoplasm of left breast: Secondary | ICD-10-CM | POA: Diagnosis not present

## 2015-07-07 DIAGNOSIS — R921 Mammographic calcification found on diagnostic imaging of breast: Secondary | ICD-10-CM

## 2015-07-15 DIAGNOSIS — M65312 Trigger thumb, left thumb: Secondary | ICD-10-CM | POA: Diagnosis not present

## 2015-07-15 DIAGNOSIS — M1812 Unilateral primary osteoarthritis of first carpometacarpal joint, left hand: Secondary | ICD-10-CM | POA: Diagnosis not present

## 2015-08-27 ENCOUNTER — Other Ambulatory Visit: Payer: Self-pay | Admitting: Family Medicine

## 2015-10-04 ENCOUNTER — Ambulatory Visit: Payer: Medicare Other | Admitting: Family Medicine

## 2016-01-20 DIAGNOSIS — G43909 Migraine, unspecified, not intractable, without status migrainosus: Secondary | ICD-10-CM | POA: Diagnosis not present

## 2016-01-20 DIAGNOSIS — Z0100 Encounter for examination of eyes and vision without abnormal findings: Secondary | ICD-10-CM | POA: Diagnosis not present

## 2016-02-23 ENCOUNTER — Telehealth: Payer: Self-pay | Admitting: Family Medicine

## 2016-02-23 ENCOUNTER — Other Ambulatory Visit: Payer: Self-pay | Admitting: Emergency Medicine

## 2016-02-23 MED ORDER — TRAZODONE HCL 150 MG PO TABS: 150.0000 mg | ORAL_TABLET | Freq: Every day | ORAL | 0 refills | Status: DC

## 2016-02-23 NOTE — Telephone Encounter (Signed)
° ° ° °  Pt request refill of the following:    traZODone (DESYREL) 150 MG tablet   Phamacy:  [place]

## 2016-02-23 NOTE — Telephone Encounter (Signed)
Rx sent to pharmacy   

## 2016-02-29 DIAGNOSIS — Z23 Encounter for immunization: Secondary | ICD-10-CM | POA: Diagnosis not present

## 2016-04-20 ENCOUNTER — Other Ambulatory Visit: Payer: Self-pay | Admitting: Family Medicine

## 2016-06-06 ENCOUNTER — Other Ambulatory Visit: Payer: Self-pay | Admitting: Family Medicine

## 2016-06-06 NOTE — Telephone Encounter (Signed)
traZODone (DESYREL) 150 MG tablet Last refill 02/23/16.  Last office visit 04/28/15.  Okay to fill?

## 2016-06-06 NOTE — Telephone Encounter (Signed)
Needs follow up.  Refill #30 until follow up.

## 2016-06-09 ENCOUNTER — Encounter: Payer: Self-pay | Admitting: Family Medicine

## 2016-06-09 ENCOUNTER — Telehealth: Payer: Self-pay | Admitting: Family Medicine

## 2016-06-09 ENCOUNTER — Ambulatory Visit (INDEPENDENT_AMBULATORY_CARE_PROVIDER_SITE_OTHER): Payer: Medicare Other | Admitting: Family Medicine

## 2016-06-09 VITALS — BP 130/80 | HR 88 | Temp 98.6°F | Wt 157.7 lb

## 2016-06-09 DIAGNOSIS — R300 Dysuria: Secondary | ICD-10-CM

## 2016-06-09 DIAGNOSIS — F5104 Psychophysiologic insomnia: Secondary | ICD-10-CM | POA: Diagnosis not present

## 2016-06-09 DIAGNOSIS — E039 Hypothyroidism, unspecified: Secondary | ICD-10-CM

## 2016-06-09 LAB — TSH: TSH: 1.76 u[IU]/mL (ref 0.35–4.50)

## 2016-06-09 LAB — POCT URINALYSIS DIPSTICK
Bilirubin, UA: NEGATIVE
Glucose, UA: NEGATIVE
Ketones, UA: NEGATIVE
Leukocytes, UA: NEGATIVE
NITRITE UA: NEGATIVE
PROTEIN UA: NEGATIVE
SPEC GRAV UA: 1.025 (ref 1.010–1.025)
UROBILINOGEN UA: 0.2 U/dL
pH, UA: 5 (ref 5.0–8.0)

## 2016-06-09 LAB — URINALYSIS, MICROSCOPIC ONLY

## 2016-06-09 MED ORDER — TRAZODONE HCL 150 MG PO TABS: 150.0000 mg | ORAL_TABLET | Freq: Every day | ORAL | 3 refills | Status: DC

## 2016-06-09 NOTE — Progress Notes (Signed)
Subjective:     Patient ID: Taylor Kelley, female   DOB: [birth year], 76 y.o.   MRN: [id number]  HPI   For routine medical follow-up. She has history of chronic insomnia and is stable on trazodone 150 mg daily at bedtime. She has hypothyroidism and takes Armour thyroid 60 mg daily. Compliant with therapy. She states she's had some recent issues with cold intolerance, dry scan, and some gradual weight gain. Last thyroid levels were over a year ago. She denies any chest pains. No dyspnea with exertion.  Also complains of some mild urine frequency and occasional burning.  No fever or chills.  Past Medical History:  Diagnosis Date  . Allergy   . GERD (gastroesophageal reflux disease)   . Heart murmur   . Osteopenia  04/2013   T score -1.7 FRAX 11%/2.1%  . Thyroid disease   . Urine incontinence   . UTI (urinary tract infection)    Past Surgical History:  Procedure Laterality Date  . ENDOMETRIAL BIOPSY  2009   polyp    reports that she quit smoking about 38 years ago. Her smoking use included Cigarettes. She has a 10.00 pack-year smoking history. She has never used smokeless tobacco. She reports that she drinks about 3.5 oz of alcohol per week . She reports that she does not use drugs. family history includes Breast cancer (age of onset: 72) in her other; Breast cancer (age of onset: 36) in her sister; Cancer in her paternal grandfather; Heart disease in her father and mother; Hypertension in her mother; Stroke in her mother. No Known Allergies   Review of Systems  Constitutional: Positive for fatigue and unexpected weight change.  Eyes: Negative for visual disturbance.  Respiratory: Negative for cough, chest tightness, shortness of breath and wheezing.   Cardiovascular: Negative for chest pain, palpitations and leg swelling.  Neurological: Negative for dizziness, seizures, syncope, weakness, light-headedness and headaches.       Objective:   Physical Exam  Constitutional: She appears  well-developed and well-nourished.  Cardiovascular: Normal rate and regular rhythm.   Murmur heard. 2/6 Murmur right upper sternal border over aortic valve  Pulmonary/Chest: Effort normal and breath sounds normal. No respiratory distress. She has no wheezes. She has no rales.       Assessment:     #1 chronic insomnia  #2 hypothyroidism  #3 dysuria.  Urine dip shows only trace blood    Plan:     -Check TSH -Continue trazodone 150 mg daily at bedtime -send urine microscopy. -Set up Medicare wellness visit  Eulas Post MD [facility]

## 2016-06-09 NOTE — Telephone Encounter (Signed)
Patient needs  traZODone (DESYREL) 150 MG tablet 30 tablet 0 06/06/2016   90 days sent to: [facility], [place], [place] - [address] [phone] (Phone) [phone] (Fax)

## 2016-06-09 NOTE — Progress Notes (Signed)
Pre visit review using our clinic review tool, if applicable. No additional management support is needed unless otherwise documented below in the visit note. 

## 2016-06-09 NOTE — Telephone Encounter (Signed)
Rx called in 

## 2016-06-22 DIAGNOSIS — Z1211 Encounter for screening for malignant neoplasm of colon: Secondary | ICD-10-CM | POA: Diagnosis not present

## 2016-06-22 LAB — COLOGUARD: COLOGUARD: NEGATIVE

## 2016-06-29 ENCOUNTER — Encounter: Payer: Self-pay | Admitting: Family Medicine

## 2016-07-11 ENCOUNTER — Ambulatory Visit (INDEPENDENT_AMBULATORY_CARE_PROVIDER_SITE_OTHER): Payer: Medicare Other

## 2016-07-11 VITALS — BP 136/60 | HR 67 | Ht 67.0 in | Wt 152.0 lb

## 2016-07-11 DIAGNOSIS — Z780 Asymptomatic menopausal state: Secondary | ICD-10-CM | POA: Diagnosis not present

## 2016-07-11 NOTE — Progress Notes (Addendum)
Subjective:   Taylor Kelley is a 76 y.o. female who presents for Medicare Annual (Subsequent) preventive examination.  The Patient was informed that the wellness visit is to identify future health risk and educate and initiate measures that can reduce risk for increased disease through the lifespan.    NO ROS; Medicare Wellness Visit  Describes health as good, fair or great? Great   Psychosocial;  Has 3 children Taking care of babies at the children's home Started traveling;  Goals; to lose weight    Preventive Screening -Counseling & Management  PSV 23; thinks she gave her a few injections;  Mammogram- 06/2015; [facility] last year was fine; to schedule this year DEXA - 04/2013- [facility] did DEXA; Dr. Phineas Real  Will repeat dexa with Mammogram this year per her choice when she has mammogram; To schedule both of these   Cologuard 06/22/2016 - neg   Smoking history/ former smoker; quit 1980;  10 pack years   ETOH one manhattan almost every night;   Medication adherence or issues? No issues    RISK FACTORS Diet; Brings food   Regular exercise  Walks 2 miles a day;  Does have an Media planner in 4 story town home but uses stairs most of the time   Cardiac Risk Factors:  Advanced aged  ; >65 in women Hyperlipidemia - deferred; no issues  Diabetes neg Family History - mother had HD; Stroke Father had HD; sister had breast cancer  Sleeping; to bed 11pm and goes to sleep  1:30 she awakens; dozes some but is up and down every half hour/  Started becoming every night over the last year States the trazodone worked well until the last 6 months.    Fall risk ;fell into the railing while dragging a bag of dirt; lost balance Given education on "Fall Prevention in the Home" for more safety tips the patient can apply as appropriate.  Long term goal is to "age in place" or undecided   Mobility of Functional changes this year? no Safety;  Home is 5000 sq foot 4  floors Does utilize the space Spouse has 4th floor   Mental Health:  Any emotional problems? Anxious, depressed, irritable, sad or blue? No Denies feeling depressed or hopeless; voices pleasure in daily life How many social activities have you been engaged in within the last 2 weeks? no  Hearing Screening Comments: Hearing aids   Vision Screening Comments: Vision checked q 2 years Checked in [place] of Daily Living - See functional screen   Cognitive testing; Ad8 score; 0 or less than 2  MMSE deferred or completed if AD8 + 2 issues  Advanced Directives yes this has been comleted   Patient Care Team: Eulas Post, MD as PCP - General (Family Medicine)   Immunization History  Administered Date(s) Administered  . Influenza Split 01/02/2012, 12/23/2012  . Influenza, High Dose Seasonal PF 01/11/2015  . Influenza,inj,Quad PF,36+ Mos 02/23/2014  . Pneumococcal Conjugate-13 04/28/2015   Required Immunizations needed today  Screening test up to date or reviewed for plan of completion Health Maintenance Due  Topic Date Due  . PNA vac Low Risk Adult (2 of 2 - PPSV23) 04/27/2016   States she feels her primary care provider at [facility] gave her this. She will check and confirm prior to rec'ing PSV 23  Cardiac Risk Factors include: advanced age (>14men, >5 women)     Objective:  Vitals: BP 136/60   Pulse 67   Ht 5\' 7"  (1.702 m)   Wt 152 lb (68.9 kg)   SpO2 96%   BMI 23.81 kg/m   Body mass index is 23.81 kg/m.   Tobacco History  Smoking Status  . Former Smoker  . Packs/day: 0.50  . Years: 20.00  . Types: Cigarettes  . Quit date: 06/14/1978  Smokeless Tobacco  . Never Used     Counseling given: Yes   Past Medical History:  Diagnosis Date  . Allergy   . GERD (gastroesophageal reflux disease)   . Heart murmur   . Osteopenia  04/2013   T score -1.7 FRAX 11%/2.1%  . Thyroid disease   . Urine incontinence   . UTI (urinary tract  infection)    Past Surgical History:  Procedure Laterality Date  . ENDOMETRIAL BIOPSY  2009   polyp   Family History  Problem Relation Age of Onset  . Heart disease Mother   . Stroke Mother   . Hypertension Mother   . Heart disease Father   . Breast cancer Sister 35  . Cancer Paternal Grandfather   . Breast cancer Other 50   History  Sexual Activity  . Sexual activity: Yes  . Birth control/ protection: Post-menopausal    Outpatient Encounter Prescriptions as of 07/11/2016  Medication Sig  . ARMOUR THYROID 60 MG tablet TAKE ONE TABLET BY MOUTH EVERY DAY  . aspirin 81 MG chewable tablet Chew 81 mg by mouth daily.  . Esomeprazole Magnesium (NEXIUM PO) Take 22 mg by mouth.  . levocetirizine (XYZAL) 5 MG tablet Take 5 mg by mouth every evening.  . magnesium oxide (MAG-OX) 400 MG tablet Take 400 mg by mouth daily.  . Probiotic Product (PHILLIPS COLON HEALTH PO) Take 1 capsule by mouth daily.  . Probiotic Product (PROBIOTIC-10 PO) Take by mouth.  . traZODone (DESYREL) 150 MG tablet Take 1 tablet (150 mg total) by mouth at bedtime.   No facility-administered encounter medications on file as of 07/11/2016.     Activities of Daily Living In your present state of health, do you have any difficulty performing the following activities: 07/11/2016  Hearing? Y  Vision? N  Difficulty concentrating or making decisions? N  Walking or climbing stairs? N  Dressing or bathing? N  Doing errands, shopping? N  Preparing Food and eating ? N  Using the Toilet? N  In the past six months, have you accidently leaked urine? N  Do you have problems with loss of bowel control? N  Managing your Medications? N  Managing your Finances? N  Housekeeping or managing your Housekeeping? N  Some recent data might be hidden    Patient Care Team: Eulas Post, MD as PCP - General (Family Medicine)    Assessment:    Exercise Activities and Dietary recommendations Current Exercise Habits: Home  exercise routine, Type of exercise: walking, Time (Minutes): 50, Frequency (Times/Week): 7, Weekly Exercise (Minutes/Week): 350, Intensity: Moderate  Goals    . patient          1. Lose weight Goal is 140        Fall Risk Fall Risk  07/11/2016 04/28/2015 04/28/2015 02/23/2014 02/23/2014  Falls in the past year? Yes Yes No No No  Number falls in past yr: 1 1 - - -  Injury with Fall? - No - - -   Depression Screen PHQ 2/9 Scores 07/11/2016 04/28/2015 04/28/2015 02/23/2014  PHQ - 2 Score 0 0 0  0     Cognitive Function MMSE - Mini Mental State Exam 07/11/2016  Not completed: (No Data)    no memory issues that interfere with function Practices some brain games; sudoku     Immunization History  Administered Date(s) Administered  . Influenza Split 01/02/2012, 12/23/2012  . Influenza, High Dose Seasonal PF 01/11/2015  . Influenza,inj,Quad PF,36+ Mos 02/23/2014  . Pneumococcal Conjugate-13 04/28/2015   Screening Tests Health Maintenance  Topic Date Due  . PNA vac Low Risk Adult (2 of 2 - PPSV23) 04/27/2016  . INFLUENZA VACCINE  09/20/2016  . Fecal DNA (Cologuard)  06/23/2019  . TETANUS/TDAP  02/03/2020  . DEXA SCAN  Completed      Plan:    PCP Notes  Health Maintenance Will check with prior [facility] doctor for records for Pneumonia 23 or pharamcy; will take if she cannot confirm receipt  Discussed diet at length; takes in roughly 650 to 950 calories a day. Would like to lose weight; c/o of fatigue and insomnia, which as worsened over the last year. States she drinks one manhattan in the evening Also states she has been having nightmares;  Goes to bed at 11 and wakes up at 1:30; will sometimes sleep lightly off and on through out the rest of the night; Generally gets oob around 6am but generally gets 3 to 4 hours, if that at hs. Per Dr. Elease Hashimoto; will hold all ETOH to see if this assist sleep Also discussed meditation, journaling or other behavioral changes that may help her to go back  to sleep   Abnormal Screens / no  Referrals   Patient concerns; as discussed  Would like her trazodone refilled  Nurse Concerns;   Next PCP apt to fup with Dr. Elease Hashimoto if her sleeplessness continues  (refill Trazodone)   I have personally reviewed and noted the following in the patient's chart:   . Medical and social history . Use of alcohol, tobacco or illicit drugs  . Current medications and supplements . Functional ability and status . Nutritional status . Physical activity . Advanced directives . List of other physicians . Hospitalizations, surgeries, and ER visits in previous 12 months . Vitals . Screenings to include cognitive, depression, and falls . Referrals and appointments  In addition, I have reviewed and discussed with patient certain preventive protocols, quality metrics, and best practice recommendations. A written personalized care plan for preventive services as well as general preventive health recommendations were provided to patient.     BULAG,TXMIW, RN  07/11/2016  Above noted.  Would certainly d/c night use of ETOH as first step in addressing her insomnia.  It appears she had the Trazodone refilled in April for one year.  Eulas Post MD [facility]

## 2016-07-11 NOTE — Patient Instructions (Addendum)
Taylor Kelley , Thank you for taking time to come for your Medicare Wellness Visit. I appreciate your ongoing commitment to your health goals. Please review the following plan we discussed and let me know if I can assist you in the future.   Can check with the pharmacy to see if you had a Pneumonia vaccine there after 76yo. Will check with old doctors office to see if they gave her a Pneumonia 23  Will have a repeat DEXA (bone density) with your mammogram at the [facility]  Will outreach to schedule   Per Dr. Elease Hashimoto Please discontinue any ETOH use at this time; will renew the trazadone Come in for an apt if this does not help. Would like to see if this helps prior to changing the regiment   These are the goals we discussed: Goals    . patient          1. Lose weight Goal is 140         This is a list of the screening recommended for you and due dates:  Health Maintenance  Topic Date Due  . Pneumonia vaccines (2 of 2 - PPSV23) 04/27/2016  . Flu Shot  09/20/2016  . Cologuard (Stool DNA test)  06/23/2019  . Tetanus Vaccine  02/03/2020  . DEXA scan (bone density measurement)  Completed     Insomnia Insomnia is a sleep disorder that makes it difficult to fall asleep or to stay asleep. Insomnia can cause tiredness (fatigue), low energy, difficulty concentrating, mood swings, and poor performance at work or school. There are three different ways to classify insomnia:  Difficulty falling asleep.  Difficulty staying asleep.  Waking up too early in the morning. Any type of insomnia can be long-term (chronic) or short-term (acute). Both are common. Short-term insomnia usually lasts for three months or less. Chronic insomnia occurs at least three times a week for longer than three months. What are the causes? Insomnia may be caused by another condition, situation, or substance, such as:  Anxiety.  Certain medicines.  Gastroesophageal reflux disease (GERD) or other  gastrointestinal conditions.  Asthma or other breathing conditions.  Restless legs syndrome, sleep apnea, or other sleep disorders.  Chronic pain.  Menopause. This may include hot flashes.  Stroke.  Abuse of alcohol, tobacco, or illegal drugs.  Depression.  Caffeine.  Neurological disorders, such as Alzheimer disease.  An overactive thyroid (hyperthyroidism). The cause of insomnia may not be known. What increases the risk? Risk factors for insomnia include:  Gender. Women are more commonly affected than men.  Age. Insomnia is more common as you get older.  Stress. This may involve your professional or personal life.  Income. Insomnia is more common in people with lower income.  Lack of exercise.  Irregular work schedule or night shifts.  Traveling between different time zones. What are the signs or symptoms? If you have insomnia, trouble falling asleep or trouble staying asleep is the main symptom. This may lead to other symptoms, such as:  Feeling fatigued.  Feeling nervous about going to sleep.  Not feeling rested in the morning.  Having trouble concentrating.  Feeling irritable, anxious, or depressed. How is this treated? Treatment for insomnia depends on the cause. If your insomnia is caused by an underlying condition, treatment will focus on addressing the condition. Treatment may also include:  Medicines to help you sleep.  Counseling or therapy.  Lifestyle adjustments. Follow these instructions at home:  Take medicines only as directed by  your health care provider.  Keep regular sleeping and waking hours. Avoid naps.  Keep a sleep diary to help you and your health care provider figure out what could be causing your insomnia. Include:  When you sleep.  When you wake up during the night.  How well you sleep.  How rested you feel the next day.  Any side effects of medicines you are taking.  What you eat and drink.  Make your bedroom a  comfortable place where it is easy to fall asleep:  Put up shades or special blackout curtains to block light from outside.  Use a white noise machine to block noise.  Keep the temperature cool.  Exercise regularly as directed by your health care provider. Avoid exercising right before bedtime.  Use relaxation techniques to manage stress. Ask your health care provider to suggest some techniques that may work well for you. These may include:  Breathing exercises.  Routines to release muscle tension.  Visualizing peaceful scenes.  Cut back on alcohol, caffeinated beverages, and cigarettes, especially close to bedtime. These can disrupt your sleep.  Do not overeat or eat spicy foods right before bedtime. This can lead to digestive discomfort that can make it hard for you to sleep.  Limit screen use before bedtime. This includes:  Watching TV.  Using your smartphone, tablet, and computer.  Stick to a routine. This can help you fall asleep faster. Try to do a quiet activity, brush your teeth, and go to bed at the same time each night.  Get out of bed if you are still awake after 15 minutes of trying to sleep. Keep the lights down, but try reading or doing a quiet activity. When you feel sleepy, go back to bed.  Make sure that you drive carefully. Avoid driving if you feel very sleepy.  Keep all follow-up appointments as directed by your health care provider. This is important. Contact a health care provider if:  You are tired throughout the day or have trouble in your daily routine due to sleepiness.  You continue to have sleep problems or your sleep problems get worse. Get help right away if:  You have serious thoughts about hurting yourself or someone else. This information is not intended to replace advice given to you by your health care provider. Make sure you discuss any questions you have with your health care provider. Document Released: 02/04/2000 Document Revised:  07/09/2015 Document Reviewed: 11/07/2013 Elsevier Interactive Patient Education  2017 [place] Maintenance for Postmenopausal Women Menopause is a normal process in which your reproductive ability comes to an end. This process happens gradually over a span of months to years, usually between the ages of 51 and 14. Menopause is complete when you have missed 12 consecutive menstrual periods. It is important to talk with your health care provider about some of the most common conditions that affect postmenopausal women, such as heart disease, cancer, and bone loss (osteoporosis). Adopting a healthy lifestyle and getting preventive care can help to promote your health and wellness. Those actions can also lower your chances of developing some of these common conditions. What should I know about menopause? During menopause, you may experience a number of symptoms, such as:  Moderate-to-severe hot flashes.  Night sweats.  Decrease in sex drive.  Mood swings.  Headaches.  Tiredness.  Irritability.  Memory problems.  Insomnia. Choosing to treat or not to treat menopausal changes is an individual decision that you make with your  health care provider. What should I know about hormone replacement therapy and supplements? Hormone therapy products are effective for treating symptoms that are associated with menopause, such as hot flashes and night sweats. Hormone replacement carries certain risks, especially as you become older. If you are thinking about using estrogen or estrogen with progestin treatments, discuss the benefits and risks with your health care provider. What should I know about heart disease and stroke? Heart disease, heart attack, and stroke become more likely as you age. This may be due, in part, to the hormonal changes that your body experiences during menopause. These can affect how your body processes dietary fats, triglycerides, and cholesterol. Heart attack and  stroke are both medical emergencies. There are many things that you can do to help prevent heart disease and stroke:  Have your blood pressure checked at least every 1-2 years. High blood pressure causes heart disease and increases the risk of stroke.  If you are 76-74 years old, ask your health care provider if you should take aspirin to prevent a heart attack or a stroke.  Do not use any tobacco products, including cigarettes, chewing tobacco, or electronic cigarettes. If you need help quitting, ask your health care provider.  It is important to eat a healthy diet and maintain a healthy weight.  Be sure to include plenty of vegetables, fruits, low-fat dairy products, and lean protein.  Avoid eating foods that are high in solid fats, added sugars, or salt (sodium).  Get regular exercise. This is one of the most important things that you can do for your health.  Try to exercise for at least 150 minutes each week. The type of exercise that you do should increase your heart rate and make you sweat. This is known as moderate-intensity exercise.  Try to do strengthening exercises at least twice each week. Do these in addition to the moderate-intensity exercise.  Know your numbers.Ask your health care provider to check your cholesterol and your blood glucose. Continue to have your blood tested as directed by your health care provider. What should I know about cancer screening? There are several types of cancer. Take the following steps to reduce your risk and to catch any cancer development as early as possible. Breast Cancer  Practice breast self-awareness.  This means understanding how your breasts normally appear and feel.  It also means doing regular breast self-exams. Let your health care provider know about any changes, no matter how small.  If you are 79 or older, have a clinician do a breast exam (clinical breast exam or CBE) every year. Depending on your age, family history, and  medical history, it may be recommended that you also have a yearly breast X-ray (mammogram).  If you have a family history of breast cancer, talk with your health care provider about genetic screening.  If you are at high risk for breast cancer, talk with your health care provider about having an MRI and a mammogram every year.  Breast cancer (BRCA) gene test is recommended for women who have family members with BRCA-related cancers. Results of the assessment will determine the need for genetic counseling and BRCA1 and for BRCA2 testing. BRCA-related cancers include these types:  Breast. This occurs in males or females.  Ovarian.  Tubal. This may also be called fallopian tube cancer.  Cancer of the abdominal or pelvic lining (peritoneal cancer).  Prostate.  Pancreatic. Cervical, Uterine, and Ovarian Cancer  Your health care provider may recommend that you be screened  regularly for cancer of the pelvic organs. These include your ovaries, uterus, and vagina. This screening involves a pelvic exam, which includes checking for microscopic changes to the surface of your cervix (Pap test).  For women ages 21-65, health care providers may recommend a pelvic exam and a Pap test every three years. For women ages 72-65, they may recommend the Pap test and pelvic exam, combined with testing for human papilloma virus (HPV), every five years. Some types of HPV increase your risk of cervical cancer. Testing for HPV may also be done on women of any age who have unclear Pap test results.  Other health care providers may not recommend any screening for nonpregnant women who are considered low risk for pelvic cancer and have no symptoms. Ask your health care provider if a screening pelvic exam is right for you.  If you have had past treatment for cervical cancer or a condition that could lead to cancer, you need Pap tests and screening for cancer for at least 20 years after your treatment. If Pap tests have  been discontinued for you, your risk factors (such as having a new sexual partner) need to be reassessed to determine if you should start having screenings again. Some women have medical problems that increase the chance of getting cervical cancer. In these cases, your health care provider may recommend that you have screening and Pap tests more often.  If you have a family history of uterine cancer or ovarian cancer, talk with your health care provider about genetic screening.  If you have vaginal bleeding after reaching menopause, tell your health care provider.  There are currently no reliable tests available to screen for ovarian cancer. Lung Cancer  Lung cancer screening is recommended for adults 60-68 years old who are at high risk for lung cancer because of a history of smoking. A yearly low-dose CT scan of the lungs is recommended if you:  Currently smoke.  Have a history of at least 30 pack-years of smoking and you currently smoke or have quit within the past 15 years. A pack-year is smoking an average of one pack of cigarettes per day for one year. Yearly screening should:  Continue until it has been 15 years since you quit.  Stop if you develop a health problem that would prevent you from having lung cancer treatment. Colorectal Cancer  This type of cancer can be detected and can often be prevented.  Routine colorectal cancer screening usually begins at age 3 and continues through age 63.  If you have risk factors for colon cancer, your health care provider may recommend that you be screened at an earlier age.  If you have a family history of colorectal cancer, talk with your health care provider about genetic screening.  Your health care provider may also recommend using home test kits to check for hidden blood in your stool.  A small camera at the end of a tube can be used to examine your colon directly (sigmoidoscopy or colonoscopy). This is done to check for the earliest  forms of colorectal cancer.  Direct examination of the colon should be repeated every 5-10 years until age 5. However, if early forms of precancerous polyps or small growths are found or if you have a family history or genetic risk for colorectal cancer, you may need to be screened more often. Skin Cancer  Check your skin from head to toe regularly.  Monitor any moles. Be sure to tell your health care provider:  About any new moles or changes in moles, especially if there is a change in a mole's shape or color.  If you have a mole that is larger than the size of a pencil eraser.  If any of your family members has a history of skin cancer, especially at a young age, talk with your health care provider about genetic screening.  Always use sunscreen. Apply sunscreen liberally and repeatedly throughout the day.  Whenever you are outside, protect yourself by wearing long sleeves, pants, a wide-brimmed hat, and sunglasses. What should I know about osteoporosis? Osteoporosis is a condition in which bone destruction happens more quickly than new bone creation. After menopause, you may be at an increased risk for osteoporosis. To help prevent osteoporosis or the bone fractures that can happen because of osteoporosis, the following is recommended:  If you are 73-68 years old, get at least 1,000 mg of calcium and at least 600 mg of vitamin D per day.  If you are older than age 57 but younger than age 64, get at least 1,200 mg of calcium and at least 600 mg of vitamin D per day.  If you are older than age 69, get at least 1,200 mg of calcium and at least 800 mg of vitamin D per day. Smoking and excessive alcohol intake increase the risk of osteoporosis. Eat foods that are rich in calcium and vitamin D, and do weight-bearing exercises several times each week as directed by your health care provider. What should I know about how menopause affects my mental health? Depression may occur at any age, but  it is more common as you become older. Common symptoms of depression include:  Low or sad mood.  Changes in sleep patterns.  Changes in appetite or eating patterns.  Feeling an overall lack of motivation or enjoyment of activities that you previously enjoyed.  Frequent crying spells. Talk with your health care provider if you think that you are experiencing depression. What should I know about immunizations? It is important that you get and maintain your immunizations. These include:  Tetanus, diphtheria, and pertussis (Tdap) booster vaccine.  Influenza every year before the flu season begins.  Pneumonia vaccine.  Shingles vaccine. Your health care provider may also recommend other immunizations. This information is not intended to replace advice given to you by your health care provider. Make sure you discuss any questions you have with your health care provider. Document Released: 03/31/2005 Document Revised: 08/27/2015 Document Reviewed: 11/10/2014 Elsevier Interactive Patient Education  2017 [place] Prevention in the Home Falls can cause injuries and can affect people from all age groups. There are many simple things that you can do to make your home safe and to help prevent falls. What can I do on the outside of my home?  Regularly repair the edges of walkways and driveways and fix any cracks.  Remove high doorway thresholds.  Trim any shrubbery on the main path into your home.  Use bright outdoor lighting.  Clear walkways of debris and clutter, including tools and rocks.  Regularly check that handrails are securely fastened and in good repair. Both sides of any steps should have handrails.  Install guardrails along the edges of any raised decks or porches.  Have leaves, snow, and ice cleared regularly.  Use sand or salt on walkways during winter months.  In the garage, clean up any spills right away, including grease or oil spills. What can I  do in the  bathroom?  Use night lights.  Install grab bars by the toilet and in the tub and shower. Do not use towel bars as grab bars.  Use non-skid mats or decals on the floor of the tub or shower.  If you need to sit down while you are in the shower, use a plastic, non-slip stool.  Keep the floor dry. Immediately clean up any water that spills on the floor.  Remove soap buildup in the tub or shower on a regular basis.  Attach bath mats securely with double-sided non-slip rug tape.  Remove throw rugs and other tripping hazards from the floor. What can I do in the bedroom?  Use night lights.  Make sure that a bedside light is easy to reach.  Do not use oversized bedding that drapes onto the floor.  Have a firm chair that has side arms to use for getting dressed.  Remove throw rugs and other tripping hazards from the floor. What can I do in the kitchen?  Clean up any spills right away.  Avoid walking on wet floors.  Place frequently used items in easy-to-reach places.  If you need to reach for something above you, use a sturdy step stool that has a grab bar.  Keep electrical cables out of the way.  Do not use floor polish or wax that makes floors slippery. If you have to use wax, make sure that it is non-skid floor wax.  Remove throw rugs and other tripping hazards from the floor. What can I do in the stairways?  Do not leave any items on the stairs.  Make sure that there are handrails on both sides of the stairs. Fix handrails that are broken or loose. Make sure that handrails are as long as the stairways.  Check any carpeting to make sure that it is firmly attached to the stairs. Fix any carpet that is loose or worn.  Avoid having throw rugs at the top or bottom of stairways, or secure the rugs with carpet tape to prevent them from moving.  Make sure that you have a light switch at the top of the stairs and the bottom of the stairs. If you do not have them, have  them installed. What are some other fall prevention tips?  Wear closed-toe shoes that fit well and support your feet. Wear shoes that have rubber soles or low heels.  When you use a stepladder, make sure that it is completely opened and that the sides are firmly locked. Have someone hold the ladder while you are using it. Do not climb a closed stepladder.  Add color or contrast paint or tape to grab bars and handrails in your home. Place contrasting color strips on the first and last steps.  Use mobility aids as needed, such as canes, walkers, scooters, and crutches.  Turn on lights if it is dark. Replace any light bulbs that burn out.  Set up furniture so that there are clear paths. Keep the furniture in the same spot.  Fix any uneven floor surfaces.  Choose a carpet design that does not hide the edge of steps of a stairway.  Be aware of any and all pets.  Review your medicines with your healthcare provider. Some medicines can cause dizziness or changes in blood pressure, which increase your risk of falling. Talk with your health care provider about other ways that you can decrease your risk of falls. This may include working with a physical therapist or trainer to improve your  strength, balance, and endurance. This information is not intended to replace advice given to you by your health care provider. Make sure you discuss any questions you have with your health care provider. Document Released: 01/27/2002 Document Revised: 07/06/2015 Document Reviewed: 03/13/2014 Elsevier Interactive Patient Education  2017 [employer].

## 2016-08-30 ENCOUNTER — Other Ambulatory Visit: Payer: Self-pay | Admitting: Sports Medicine

## 2016-11-03 ENCOUNTER — Other Ambulatory Visit: Payer: Self-pay | Admitting: Family Medicine

## 2016-11-03 DIAGNOSIS — Z1231 Encounter for screening mammogram for malignant neoplasm of breast: Secondary | ICD-10-CM

## 2016-11-03 DIAGNOSIS — E2839 Other primary ovarian failure: Secondary | ICD-10-CM

## 2016-11-22 ENCOUNTER — Ambulatory Visit
Admission: RE | Admit: 2016-11-22 | Discharge: 2016-11-22 | Disposition: A | Discharge status: Home / Self Care | Payer: Medicare Other | Source: Ambulatory Visit | Attending: Family Medicine | Admitting: Family Medicine

## 2016-11-22 DIAGNOSIS — E2839 Other primary ovarian failure: Secondary | ICD-10-CM

## 2016-11-22 DIAGNOSIS — Z1231 Encounter for screening mammogram for malignant neoplasm of breast: Secondary | ICD-10-CM | POA: Diagnosis not present

## 2016-11-22 DIAGNOSIS — Z780 Asymptomatic menopausal state: Secondary | ICD-10-CM | POA: Diagnosis not present

## 2016-11-22 DIAGNOSIS — M85851 Other specified disorders of bone density and structure, right thigh: Secondary | ICD-10-CM | POA: Diagnosis not present

## 2016-12-07 DIAGNOSIS — Z23 Encounter for immunization: Secondary | ICD-10-CM | POA: Diagnosis not present

## 2016-12-28 ENCOUNTER — Encounter: Payer: Self-pay | Admitting: Family Medicine

## 2016-12-28 ENCOUNTER — Telehealth: Payer: Self-pay | Admitting: Family Medicine

## 2016-12-28 ENCOUNTER — Ambulatory Visit (INDEPENDENT_AMBULATORY_CARE_PROVIDER_SITE_OTHER): Payer: Medicare Other | Admitting: Family Medicine

## 2016-12-28 VITALS — BP 122/82 | HR 85 | Temp 98.4°F | Ht 67.0 in | Wt 159.0 lb

## 2016-12-28 DIAGNOSIS — R300 Dysuria: Secondary | ICD-10-CM | POA: Diagnosis not present

## 2016-12-28 LAB — POCT URINALYSIS DIPSTICK
Bilirubin, UA: NEGATIVE
Glucose, UA: NEGATIVE
Ketones, UA: NEGATIVE
Nitrite, UA: NEGATIVE
PROTEIN UA: 30
Spec Grav, UA: 1.015 (ref 1.010–1.025)
UROBILINOGEN UA: 0.2 U/dL
pH, UA: 7.5 (ref 5.0–8.0)

## 2016-12-28 MED ORDER — NITROFURANTOIN MONOHYD MACRO 100 MG PO CAPS: 100.0000 mg | ORAL_CAPSULE | Freq: Two times a day (BID) | ORAL | 0 refills | Status: DC

## 2016-12-28 NOTE — Patient Instructions (Addendum)
Take the antibiotic as instructed  Follow-up if symptoms worsen, persist or new symptoms arise.  You are feeling better soon!

## 2016-12-28 NOTE — Telephone Encounter (Signed)
Okay with me 

## 2016-12-28 NOTE — Telephone Encounter (Signed)
Pt want to switch to dr.kim.

## 2016-12-28 NOTE — Addendum Note (Signed)
Addended by: Agnes Lawrence on: 12/28/2016 02:23 PM   Modules accepted: Orders

## 2016-12-28 NOTE — Telephone Encounter (Signed)
Ok with me 

## 2016-12-28 NOTE — Progress Notes (Signed)
HPI:  Acute visit for dysuria: -Started about a week ago -Symptoms include frequency, urgency, dysuria -Denies: Fevers, malaise, hematuria, vaginal discharge, abdominal pain, flank pain, nausea or vomiting History of UTI   ROS: See pertinent positives and negatives per HPI.  Past Medical History:  Diagnosis Date  . Allergy   . GERD (gastroesophageal reflux disease)   . Heart murmur   . Osteopenia  04/2013   T score -1.7 FRAX 11%/2.1%  . Thyroid disease   . Urine incontinence   . UTI (urinary tract infection)     Past Surgical History:  Procedure Laterality Date  . BREAST BIOPSY Left    fibroadenoma  . ENDOMETRIAL BIOPSY  2009   polyp    Family History  Problem Relation Age of Onset  . Heart disease Mother   . Stroke Mother   . Hypertension Mother   . Heart disease Father   . Breast cancer Other 50  . Breast cancer Sister 55  . Cancer Paternal Grandfather     Social History   Socioeconomic History  . Marital status: Married    Spouse name: None  . Number of children: None  . Years of education: None  . Highest education level: None  Social Needs  . Financial resource strain: None  . Food insecurity - worry: None  . Food insecurity - inability: None  . Transportation needs - medical: None  . Transportation needs - non-medical: None  Occupational History  . None  Tobacco Use  . Smoking status: Former Smoker    Packs/day: 0.50    Years: 20.00    Pack years: 10.00    Types: Cigarettes    Last attempt to quit: 06/14/1978    Years since quitting: 38.5  . Smokeless tobacco: Never Used  Substance and Sexual Activity  . Alcohol use: Yes    Alcohol/week: 3.5 oz    Types: 7 Standard drinks or equivalent per week    Comment: [place] roughly 5 days a week   . Drug use: No  . Sexual activity: Yes    Birth control/protection: Post-menopausal  Other Topics Concern  . None  Social History Narrative  . None     Current Outpatient Medications:  .   ARMOUR THYROID 60 MG tablet, TAKE ONE TABLET BY MOUTH EVERY DAY, Disp: 90 tablet, Rfl: 3 .  aspirin 81 MG chewable tablet, Chew 81 mg by mouth daily., Disp: , Rfl:  .  CALCIUM PO, Take by mouth., Disp: , Rfl:  .  Cholecalciferol (VITAMIN D PO), Take by mouth., Disp: , Rfl:  .  Esomeprazole Magnesium (NEXIUM PO), Take 22 mg by mouth., Disp: , Rfl:  .  levocetirizine (XYZAL) 5 MG tablet, Take 5 mg by mouth every evening., Disp: , Rfl:  .  Probiotic Product (PHILLIPS COLON HEALTH PO), Take 1 capsule by mouth daily., Disp: , Rfl:  .  Probiotic Product (PROBIOTIC-10 PO), Take by mouth., Disp: , Rfl:  .  traZODone (DESYREL) 150 MG tablet, Take 1 tablet (150 mg total) by mouth at bedtime., Disp: 90 tablet, Rfl: 3 .  nitrofurantoin, macrocrystal-monohydrate, (MACROBID) 100 MG capsule, Take 1 capsule (100 mg total) 2 (two) times daily by mouth., Disp: 14 capsule, Rfl: 0  EXAM:  Vitals:   12/28/16 1352  BP: 122/82  Pulse: 85  Temp: 98.4 F (36.9 C)    Body mass index is 24.9 kg/m.  GENERAL: vitals reviewed and listed above, alert, oriented, appears well hydrated and in no acute distress  HEENT: atraumatic, conjunttiva clear, no obvious abnormalities on inspection of external nose and ears  NECK: no obvious masses on inspection  LUNGS: clear to auscultation bilaterally, no wheezes, rales or rhonchi, good air movement  CV: HRRR, no peripheral edema  ABD: Bowel sounds positive, soft, nontender to palpation, no CVA tenderness  MS: moves all extremities without noticeable abnormality  PSYCH: pleasant and cooperative, no obvious depression or anxiety  ASSESSMENT AND PLAN:  Discussed the following assessment and plan:  Dysuria - Plan: POC Urinalysis Dipstick  -U dip findings  + symptoms suggest UTI, she opted to start treatment, culture pending -Patient advised to return or notify a doctor immediately if symptoms worsen or persist or new concerns arise.  Patient Instructions  Take  the antibiotic as instructed  Follow-up if symptoms worsen, persist or new symptoms arise.  You are feeling better soon!   Colin Benton R., DO

## 2016-12-29 NOTE — Telephone Encounter (Signed)
Pt will call back to make a appt with dr. Maudie Mercury

## 2016-12-31 LAB — URINE CULTURE
MICRO NUMBER:: 81258656
SPECIMEN QUALITY:: ADEQUATE

## 2017-01-01 MED ORDER — CIPROFLOXACIN HCL 250 MG PO TABS: 250.0000 mg | ORAL_TABLET | Freq: Two times a day (BID) | ORAL | 0 refills | Status: DC

## 2017-01-01 NOTE — Addendum Note (Signed)
Addended by: Agnes Lawrence on: 01/01/2017 08:51 AM   Modules accepted: Orders

## 2017-01-02 ENCOUNTER — Ambulatory Visit (INDEPENDENT_AMBULATORY_CARE_PROVIDER_SITE_OTHER): Payer: Medicare Other | Admitting: Family Medicine

## 2017-01-02 ENCOUNTER — Encounter: Payer: Self-pay | Admitting: Family Medicine

## 2017-01-02 VITALS — BP 138/84 | HR 81 | Temp 98.4°F | Ht 67.0 in

## 2017-01-02 DIAGNOSIS — H1033 Unspecified acute conjunctivitis, bilateral: Secondary | ICD-10-CM

## 2017-01-02 MED ORDER — ERYTHROMYCIN 5 MG/GM OP OINT: 1.0000 "application " | TOPICAL_OINTMENT | Freq: Every day | OPHTHALMIC | 0 refills | Status: DC

## 2017-01-02 NOTE — Progress Notes (Signed)
HPI:  Acute visit for irritated left eye with drainage.  Started about 3 days ago.  Has been doing compresses.  She is also started to develop some irritation of the right eye.  Vision is fine.  No pain.  She had a little bit of discolored drainage in the left eye this morning.  Denies fevers, headaches, vision changes, trauma to the eye or redness or swelling around the eyes.  ROS: See pertinent positives and negatives per HPI.  Past Medical History:  Diagnosis Date  . Allergy   . GERD (gastroesophageal reflux disease)   . Heart murmur   . Osteopenia  04/2013   T score -1.7 FRAX 11%/2.1%  . Thyroid disease   . Urine incontinence   . UTI (urinary tract infection)     Past Surgical History:  Procedure Laterality Date  . BREAST BIOPSY Left    fibroadenoma  . ENDOMETRIAL BIOPSY  2009   polyp    Family History  Problem Relation Age of Onset  . Heart disease Mother   . Stroke Mother   . Hypertension Mother   . Heart disease Father   . Breast cancer Other 50  . Breast cancer Sister 3  . Cancer Paternal Grandfather     Social History   Socioeconomic History  . Marital status: Married    Spouse name: None  . Number of children: None  . Years of education: None  . Highest education level: None  Social Needs  . Financial resource strain: None  . Food insecurity - worry: None  . Food insecurity - inability: None  . Transportation needs - medical: None  . Transportation needs - non-medical: None  Occupational History  . None  Tobacco Use  . Smoking status: Former Smoker    Packs/day: 0.50    Years: 20.00    Pack years: 10.00    Types: Cigarettes    Last attempt to quit: 06/14/1978    Years since quitting: 38.5  . Smokeless tobacco: Never Used  Substance and Sexual Activity  . Alcohol use: Yes    Alcohol/week: 3.5 oz    Types: 7 Standard drinks or equivalent per week    Comment: [place] roughly 5 days a week   . Drug use: No  . Sexual activity: Yes    Birth  control/protection: Post-menopausal  Other Topics Concern  . None  Social History Narrative  . None     Current Outpatient Medications:  .  ARMOUR THYROID 60 MG tablet, TAKE ONE TABLET BY MOUTH EVERY DAY, Disp: 90 tablet, Rfl: 3 .  aspirin 81 MG chewable tablet, Chew 81 mg by mouth daily., Disp: , Rfl:  .  CALCIUM PO, Take by mouth., Disp: , Rfl:  .  Cholecalciferol (VITAMIN D PO), Take by mouth., Disp: , Rfl:  .  ciprofloxacin (CIPRO) 250 MG tablet, Take 1 tablet (250 mg total) 2 (two) times daily by mouth., Disp: 10 tablet, Rfl: 0 .  Esomeprazole Magnesium (NEXIUM PO), Take 22 mg by mouth., Disp: , Rfl:  .  levocetirizine (XYZAL) 5 MG tablet, Take 5 mg by mouth every evening., Disp: , Rfl:  .  Probiotic Product (PHILLIPS COLON HEALTH PO), Take 1 capsule by mouth daily., Disp: , Rfl:  .  Probiotic Product (PROBIOTIC-10 PO), Take by mouth., Disp: , Rfl:  .  traZODone (DESYREL) 150 MG tablet, Take 1 tablet (150 mg total) by mouth at bedtime., Disp: 90 tablet, Rfl: 3 .  erythromycin ophthalmic ointment, Place 1 application  at bedtime into the left eye., Disp: 3.5 g, Rfl: 0  EXAM:  Vitals:   01/02/17 1119  BP: 138/84  Pulse: 81  Temp: 98.4 F (36.9 C)    Body mass index is 24.9 kg/m.  GENERAL: vitals reviewed and listed above, alert, oriented, appears well hydrated and in no acute distress  HEENT: atraumatic, conjunttiva erythematous, left greater than right, small amount of drainage, mostly clear, PERRLA, visual acuity grossly intact, EOMI,  no obvious abnormalities on inspection of external nose and ears  NECK: no obvious masses on inspection  LUNGS: clear to auscultation bilaterally, no wheezes, rales or rhonchi, good air movement  CV: HRRR, no peripheral edema  MS: moves all extremities without noticeable abnormality  PSYCH: pleasant and cooperative, no obvious depression or anxiety  ASSESSMENT AND PLAN:  Discussed the following assessment and plan:  Acute  conjunctivitis of both eyes, unspecified acute conjunctivitis type  -Antibiotic ointment given several days with reported purulent discharge, both eyes, given she feels it is starting in the right eye. -Compresses -Patient advised to return or notify a doctor immediately if symptoms worsen or persist or new concerns arise.  Patient Instructions  Use the the ointment as instructed.  Compresses several times daily.  Follow up with the eye doctor if worsening or not improving.   Colin Benton R., DO

## 2017-01-02 NOTE — Patient Instructions (Signed)
Use the the ointment as instructed.  Compresses several times daily.  Follow up with the eye doctor if worsening or not improving.

## 2017-02-23 ENCOUNTER — Other Ambulatory Visit: Payer: Self-pay | Admitting: Family Medicine

## 2017-02-24 ENCOUNTER — Other Ambulatory Visit: Payer: Self-pay | Admitting: Family Medicine

## 2017-02-28 ENCOUNTER — Ambulatory Visit (INDEPENDENT_AMBULATORY_CARE_PROVIDER_SITE_OTHER)
Admission: RE | Admit: 2017-02-28 | Discharge: 2017-02-28 | Disposition: A | Discharge status: Home / Self Care | Payer: Medicare Other | Source: Ambulatory Visit | Attending: Sports Medicine | Admitting: Sports Medicine

## 2017-02-28 ENCOUNTER — Telehealth: Payer: Self-pay | Admitting: Family Medicine

## 2017-02-28 ENCOUNTER — Encounter: Payer: Self-pay | Admitting: Sports Medicine

## 2017-02-28 ENCOUNTER — Ambulatory Visit (INDEPENDENT_AMBULATORY_CARE_PROVIDER_SITE_OTHER): Payer: Medicare Other | Admitting: Sports Medicine

## 2017-02-28 ENCOUNTER — Ambulatory Visit (INDEPENDENT_AMBULATORY_CARE_PROVIDER_SITE_OTHER): Payer: Medicare Other

## 2017-02-28 VITALS — BP 140/72 | HR 80 | Ht 67.0 in | Wt 160.2 lb

## 2017-02-28 DIAGNOSIS — M25561 Pain in right knee: Secondary | ICD-10-CM | POA: Diagnosis not present

## 2017-02-28 DIAGNOSIS — S82831A Other fracture of upper and lower end of right fibula, initial encounter for closed fracture: Secondary | ICD-10-CM | POA: Insufficient documentation

## 2017-02-28 DIAGNOSIS — S8264XA Nondisplaced fracture of lateral malleolus of right fibula, initial encounter for closed fracture: Secondary | ICD-10-CM | POA: Diagnosis not present

## 2017-02-28 DIAGNOSIS — M85859 Other specified disorders of bone density and structure, unspecified thigh: Secondary | ICD-10-CM

## 2017-02-28 DIAGNOSIS — M25461 Effusion, right knee: Secondary | ICD-10-CM | POA: Insufficient documentation

## 2017-02-28 DIAGNOSIS — S82401A Unspecified fracture of shaft of right fibula, initial encounter for closed fracture: Secondary | ICD-10-CM | POA: Diagnosis not present

## 2017-02-28 NOTE — Assessment & Plan Note (Signed)
Fibular head fracture can be considered a fragility fracture.   Will consider pharmacologic therapy after acute healing has completed.

## 2017-02-28 NOTE — Telephone Encounter (Signed)
She saw Dr Paulla Fore and he has already addressed her fibular fracture.

## 2017-02-28 NOTE — Telephone Encounter (Signed)
[facility] Radiology called to give results of DG of right knee: MPRESSION: There is an acute fracture of the right fibular head on the right.  Mild degenerative changes of the right knee.

## 2017-02-28 NOTE — Progress Notes (Signed)
[name]. Pacey Altizer, [place] at [place]  TIFFNAY BOSSI - 77 y.o. female MRN [id number]  Date of birth: [birth year]  Visit Date: 02/28/2017  PCP: Eulas Post, MD   Referred by: Eulas Post, MD   Scribe for today's visit: Wendy Poet, LAT, ATC     SUBJECTIVE:  Taylor Kelley is here for New Patient (Initial Visit) (R knee pain and swelling) .   Her R knee pain and swelling symptoms INITIALLY: Began  On 02/27/17 when she missed a step and fell directly on the front of her R knee. Described as 6/10 pain at it's worst but no pain at rest. Most of her pain is in her R post. knee.  Also has swelling in her R knee. Nonradiating Worsened with walking and climbing stairs. Improved with ice and Advil. Additional associated symptoms include: no N/T noted in the R LE and no radiating pain in the R LE.    At this time symptoms are improving compared to onset w/ decreased pain in the R knee. She has been icing her knee and taking Advil.   ROS Denies night time disturbances. Reports fevers, chills, or night sweats.  Yes to night sweats. Denies unexplained weight loss. Denies personal history of cancer. Denies changes in bowel or bladder habits. Reports recent unreported falls. Denies new or worsening dyspnea or wheezing. Denies headaches or dizziness.  Reports numbness, tingling or weakness  In the extremities. Pain and parathesias in the R hand. Denies dizziness or presyncopal episodes Denies lower extremity edema     HISTORY & PERTINENT PRIOR DATA:  Prior History reviewed and updated per electronic medical record.  Significant history, findings, studies and interim changes include:  reports that she quit smoking about 38 years ago. Her smoking use included cigarettes. She has a 10.00 pack-year smoking history. she has never used smokeless tobacco. No results for input(s): HGBA1C, LABURIC, CREATINE in the last 8760  hours. No specialty comments available. Problem  Closed Fracture of Upper End of Right Fibula  Effusion of Right Knee  Osteopenia   DEXA: October 2018 right femur -2.2 = osteopenia  Recommended to continue weightbearing activities, 1200 mg calcium and vitamin D OTC 1000 international units daily.       OBJECTIVE:  VS:  HT:5\' 7"  (170.2 cm)   WT:160 lb 3.2 oz (72.7 kg)  BMI:25.08    BP:140/72  HR:80bpm  TEMP: ( )  RESP:96 %   PHYSICAL EXAM: Constitutional: WDWN, Non-toxic appearing. Psychiatric: Alert & appropriately interactive. Not depressed or anxious appearing. Respiratory: No increased work of breathing. Trachea Midline Eyes: Pupils are equal. EOM intact without nystagmus. No scleral icterus Cardiovascular:  Peripheral Pulses: peripheral pulses symmetrical No clubbing or cyanosis appreciated Capillary Refill is normal, less than 2 seconds No signficant generalized edema/anasarca Sensory Exam: intact to light touch  Right knee: Overall well aligned.  No significant skin breakdown.  Ligamentously with varus and valgus testing she is stable.  She has marked pain over the fibular head and pain with fibular head shock.  Pain with resisted dorsiflexion and plantarflexion of the foot.  She has a small effusion.  No significant pain along the tibial plateaus or patella.  Extensor mechanism intact.  Range of motion from 0 degrees to 95 degrees.  No additional findings.   ASSESSMENT & PLAN:   1. Acute pain of right knee   2. Effusion of right knee   3. Other closed  fracture of proximal end of right fibula, initial encounter   4. Osteopenia of hip, unspecified laterality    PLAN: Proximal fibular fracture secondary to fall yesterday while missing a step.  She does have a small effusion additionally which is concerning for potential intra-articular involvement and further diagnostic evaluation with CT scan indicated at this time.  We will plan to place her into a knee immobilizer  for comfort and minimize weightbearing activity.  She has a upcoming trip to the [place] and as long as the CT scan does not show any further osseous injury I think she should be able to do well with this as long as she minimizes her activity.  She should continue with OTC anti-inflammatories ice and can consider progressing her into a different brace if overall good improvement.  Immobilizer should be worn at this time until further evaluation is completed and will plan to have her only wear at nighttime and with weightbearing.  She declines crutches or walker.  Osteopenia Fibular head fracture can be considered a fragility fracture.   Will consider pharmacologic therapy after acute healing has completed.   ++++++++++++++++++++++++++++++++++++++++++++ Orders & Meds: Orders Placed This Encounter  Procedures  . DG Knee AP/LAT W/Sunrise Right  . CT KNEE RIGHT WO CONTRAST    No orders of the defined types were placed in this encounter.   ++++++++++++++++++++++++++++++++++++++++++++ Follow-up: Return in about 3 weeks (around 03/21/2017).   Pertinent documentation may be included in additional procedure notes, imaging studies, problem based documentation and patient instructions. Please see these sections of the encounter for additional information regarding this visit. CMA/ATC served as Education administrator during this visit. History, Physical, and Plan performed by medical provider. Documentation and orders reviewed and attested to.      Gerda Diss, [place] Physician

## 2017-03-01 ENCOUNTER — Encounter: Payer: Self-pay | Admitting: Family Medicine

## 2017-03-01 NOTE — Progress Notes (Signed)
CT is overall reassuring and there is no change management from plan we discussed yesterday.  She is able to weight-bear on this as tolerated and should continue with the knee immobilizer for the next several days but I would like for her to be out of it by the end of the weekend for most of the day.  My chart message as below: The CT scan is reassuring.  As we discussed yesterday managing this comes down to controlling your symptoms but there are no specific limitations on other than to let pain be your guide.  You can continue with the knee immobilizing brace we provided you yesterday while you are walking but I would like for you to try to be out of it by the end of the weekend.  If you have any other issues or any worsening prior to your trip please let us know and we can see you before you leave.

## 2017-03-05 ENCOUNTER — Ambulatory Visit: Payer: Self-pay

## 2017-03-05 ENCOUNTER — Encounter: Payer: Self-pay | Admitting: Sports Medicine

## 2017-03-05 ENCOUNTER — Ambulatory Visit (INDEPENDENT_AMBULATORY_CARE_PROVIDER_SITE_OTHER): Payer: Medicare Other | Admitting: Sports Medicine

## 2017-03-05 ENCOUNTER — Ambulatory Visit: Payer: Self-pay | Admitting: *Deleted

## 2017-03-05 VITALS — BP 134/70 | HR 68 | Ht 67.0 in | Wt 160.6 lb

## 2017-03-05 DIAGNOSIS — M7989 Other specified soft tissue disorders: Secondary | ICD-10-CM

## 2017-03-05 DIAGNOSIS — M79604 Pain in right leg: Secondary | ICD-10-CM | POA: Diagnosis not present

## 2017-03-05 DIAGNOSIS — S82831D Other fracture of upper and lower end of right fibula, subsequent encounter for closed fracture with routine healing: Secondary | ICD-10-CM | POA: Diagnosis not present

## 2017-03-05 DIAGNOSIS — S82831A Other fracture of upper and lower end of right fibula, initial encounter for closed fracture: Secondary | ICD-10-CM | POA: Diagnosis not present

## 2017-03-05 NOTE — Progress Notes (Signed)
Taylor Kelley. Bria Sparr, [place] at [place]  OCTA UPLINGER - 77 y.o. female MRN [id number]  Date of birth: 09/15/1940  Visit Date: 03/05/2017  PCP: Lucretia Kern, DO   Referred by: Lucretia Kern, DO   Scribe for today's visit: Wendy Poet, LAT, ATC     SUBJECTIVE:  Taylor Kelley is here for Follow-up (R proximal fibular fracture and knee pain) .   Info from initial visit on 02/28/17: Her R knee pain and swelling symptoms INITIALLY: Began  On 02/27/17 when she missed a step and fell directly on the front of her R knee. Described as 6/10 pain at it's worst but no pain at rest. Most of her pain is in her R post. knee.  Also has swelling in her R knee. Nonradiating Worsened with walking and climbing stairs. Improved with ice and Advil. Additional associated symptoms include: no N/T noted in the R LE and no radiating pain in the R LE.    At this time symptoms are improving compared to onset w/ decreased pain in the R knee. She has been icing her knee and taking Advil.   Compared to the last office visit on 02/28/17, her previously described R knee pain symptoms are worsening with increased swelling in her R lower leg from the knee to the foot. Current symptoms are moderate & are radiating to R calf.  Reports tightness in the R knee and calf but yesterday it was throbbing. She has been taking Advil once/day.  She is not currently icing her knee.  Came out of the immobilizer yesterday during the day but did sleep in it last night.   ROS Reports night time disturbances. Reports fevers, chills, or night sweats. Denies unexplained weight loss. Denies personal history of cancer. Denies changes in bowel or bladder habits. Reports recent unreported falls. Denies new or worsening dyspnea or wheezing. Denies headaches or dizziness.  Reports numbness, tingling or weakness  In the extremities.  Pain and paresthesias in the R hand. Denies  dizziness or presyncopal episodes Reports lower extremity edema.  R lower leg only.     HISTORY & PERTINENT PRIOR DATA:  Prior History reviewed and updated per electronic medical record.  Significant history, findings, studies and interim changes include:  reports that she quit smoking about 38 years ago. Her smoking use included cigarettes. She has a 10.00 pack-year smoking history. she has never used smokeless tobacco. No results for input(s): HGBA1C, LABURIC, CREATINE in the last 8760 hours. No specialty comments available. No problems updated.  OBJECTIVE:  VS:  HT:5\' 7"  (170.2 cm)   WT:160 lb 9.6 oz (72.8 kg)  BMI:25.15    BP:134/70  HR:68bpm  TEMP: ( )  RESP:95 %   PHYSICAL EXAM: Constitutional: WDWN, Non-toxic appearing. Psychiatric: Alert & appropriately interactive.  Not depressed or anxious appearing. Respiratory: No increased work of breathing.  Trachea Midline Eyes: Pupils are equal.  EOM intact without nystagmus.  No scleral icterus   Right lower extremity has significant swelling including the calf and the pretibial region.  She has pain with calf squeeze localizing to the lateral aspect.  Generalized bruising and ecchymosis is evolving from last visit but still persistent and quite profound. No additional findings.   ASSESSMENT & PLAN:   1. Other closed fracture of proximal end of right fibula with routine healing, subsequent encounter   2. Pain and swelling of right lower extremity    PLAN: Ultimately  she has been ambulating more pain is better controlled from a general standpoint of her fracture however she is having persistent swelling and this may be related to how tight she was wrapping Ace wrap she was using.  Veins are easily compressible and she has no pain over the deep venous structures of the upper thigh given the mechanism and duration of symptoms and lack of personal or family history of DVT I am comfortable ruling this out today.  If any lack of  improvement can consider formal venous Doppler. Follow-up in 4 days to ensure clinical resolution. No problem-specific Assessment & Plan notes found for this encounter.   ++++++++++++++++++++++++++++++++++++++++++++ Orders & Meds: Orders Placed This Encounter  Procedures  . [place] LIMITED JOINT SPACE STRUCTURES LOW RIGHT(NO LINKED CHARGES)    No orders of the defined types were placed in this encounter.   ++++++++++++++++++++++++++++++++++++++++++++ Follow-up: Return in about 4 days (around 03/09/2017).   Pertinent documentation may be included in additional procedure notes, imaging studies, problem based documentation and patient instructions. Please see these sections of the encounter for additional information regarding this visit. CMA/ATC served as Education administrator during this visit. History, Physical, and Plan performed by medical provider. Documentation and orders reviewed and attested to.      Gerda Diss, [place] [facility] Physician

## 2017-03-05 NOTE — Telephone Encounter (Signed)
PT   FELLED   6  DAYS  AGO  AND  FX    HER  R  FIBULA   SHE  IS   WEARING   A   KNEE  IMMOBILIZER         AND   SHE  HAS  AN INCREASE  IN PAIN  AND  SWELLING  -     SHE   STATES   SHE  HAS  BEEN  TRYING  TO   TAKE  IT  EASY  ON THE  AFFECTED  LEG  BUT  THE   PAIN/  SWELLING IS  [place]     Reason for Disposition . [1] MODERATE pain (e.g., interferes with normal activities, limping) AND [2] present > 3 days  Answer Assessment - Initial Assessment Questions 1. ONSET: "When did the pain start?"     6  Days  Ago    2. LOCATION: "Where is the pain located?"        r      Lower   Leg   Below  The   r  Knee    3. PAIN: "How bad is the pain?"    (Scale 1-10; or mild, moderate, severe)   -  MILD (1-3): doesn't interfere with normal activities    -  MODERATE (4-7): interferes with normal activities (e.g., work or school) or awakens from sleep, limping    -  SEVERE (8-10): excruciating pain, unable to do any normal activities, unable to walk    Mod    4. WORK OR EXERCISE: "Has there been any recent work or exercise that involved this part of the body?"       Basically  Up  For  Bathroom    5. CAUSE: "What do you think is causing the leg pain?"     Fell   6  Days  Ago  And  fx  Her leg   6. OTHER SYMPTOMS: "Do you have any other symptoms?" (e.g., chest pain, back pain, breathing difficulty, swelling, rash, fever, numbness, weakness)      Swelling    Present  As   Well    7. PREGNANCY: "Is there any chance you are pregnant?" "When was your last menstrual period?"        N/A  Protocols used: LEG PAIN-A-AH

## 2017-03-09 ENCOUNTER — Encounter: Payer: Self-pay | Admitting: Sports Medicine

## 2017-03-09 ENCOUNTER — Ambulatory Visit (INDEPENDENT_AMBULATORY_CARE_PROVIDER_SITE_OTHER): Payer: Medicare Other | Admitting: Sports Medicine

## 2017-03-09 VITALS — BP 130/70 | HR 92 | Ht 67.0 in | Wt 156.2 lb

## 2017-03-09 DIAGNOSIS — S82831A Other fracture of upper and lower end of right fibula, initial encounter for closed fracture: Secondary | ICD-10-CM | POA: Diagnosis not present

## 2017-03-09 DIAGNOSIS — M85869 Other specified disorders of bone density and structure, unspecified lower leg: Secondary | ICD-10-CM | POA: Diagnosis not present

## 2017-03-09 NOTE — Patient Instructions (Addendum)
To Help with Bone Health: 1. Calcium 1500mg  daily (total from supplements and dietary) 2. Vitamin D3, between 3000 and 5000 units daily is typically safe to take.   If you take VIT D3 on a long term basis, it is recommended to have you levels check your levels to ensure you remain in a therapeutic range.    One Tums has 500 mg of calcium carbonate which equals approximately 200 mg of dietary calcium. It is safe to take 2-4 tablets on a daily basis   ++++++++++++++++++++++++++++++++++++++++++++  I recommend you obtained a compression sleeve to help with your joint problems. There are many options on the market however I recommend obtaining a R knee Body Helix compression sleeve.  You can find information (including how to appropriate measure yourself for sizing) can be found at www.Body http://www.lambert.com/.  Many of these products are health savings account (HSA) eligible.   You can use the compression sleeve at any time throughout the day but is most important to use while being active as well as for 2 hours post-activity.   It is appropriate to ice following activity with the compression sleeve in place.

## 2017-03-09 NOTE — Progress Notes (Signed)
[name], [place] at [place]  [name] - 77 y.o. female MRN [id number]  Date of birth: 05/11/1940  Visit Date: 03/09/2017  PCP: Lucretia Kern, DO   Referred by: Lucretia Kern, DO   Scribe for today's visit: Wendy Poet, LAT, ATC     SUBJECTIVE:  Taylor Kelley is here for Follow-up (R knee pain and lower leg swelling) .   Compared to the last office visit, her previously described R knee pain and lower leg swelling symptoms are improving with less knee pain and decreased swelling in the R lower leg. Current symptoms are moderate & are nonradiating She has been wearing the compression wrap and keeping her leg elevated.   ROS Denies night time disturbances. Reports fevers, chills, or night sweats.  Yes to night sweats. Denies unexplained weight loss. Denies personal history of cancer. Denies changes in bowel or bladder habits. Reports recent unreported falls. Denies new or worsening dyspnea or wheezing. Denies headaches or dizziness.  Denies numbness, tingling or weakness  In the extremities.  Denies dizziness or presyncopal episodes Denies lower extremity edema     HISTORY & PERTINENT PRIOR DATA:  Prior History reviewed and updated per electronic medical record.  Significant history, findings, studies and interim changes include:  reports that she quit smoking about 38 years ago. Her smoking use included cigarettes. She has a 10.00 pack-year smoking history. she has never used smokeless tobacco. No results for input(s): HGBA1C, LABURIC, CREATINE in the last 8760 hours. No specialty comments available. Problem  Osteopenia   DEXA: October 2018 right femur -2.2 = osteopenia  Recommended to continue weightbearing activities, 1200 mg calcium and vitamin D OTC 1000 international units daily.    Fracture of proximal fibula does give her diagnostic criteria for osteoporosis from fragility fracture.  Would  likely benefit from antiresorptive therapy and this will need to be discussed at the 79-month mark following her acute injury.  Consider Prolia     OBJECTIVE:  VS:  HT:5\' 7"  (170.2 cm)   WT:156 lb 3.2 oz (70.9 kg)  BMI:24.46    BP:130/70  HR:92bpm  TEMP: ( )  RESP:94 %   PHYSICAL EXAM: Constitutional: WDWN, Non-toxic appearing. Psychiatric: Alert & appropriately interactive.  Not depressed or anxious appearing. Respiratory: No increased work of breathing.  Trachea Midline Eyes: Pupils are equal.  EOM intact without nystagmus.  No scleral icterus  NEUROVASCULAR exam: No clubbing or cyanosis appreciated No significant venous stasis changes Capillary Refill: normal, less than 2 seconds    LOWER Extremities  SWELLING Pre-tibial edema: Significantly improved.  Only trace pretibial edema on the right compared to the left.  Her foot and entire leg are markedly less swollen.  PULSES Pedal Pulses: Normal & symmetrically palpable  SENSORY Dermatomes intact to light touch  MOTOR Normal strength in all myotomes      Right leg is overall significantly better than last office visit.  The marked swelling her lower leg has improved.  She still has some persistent ecchymosis across the anterior leg.  Calf muscles are supple although slight pain within the posterior lateral gastroc but given prior injury.  She has pain directly over the fibular head.  No additional findings.   ASSESSMENT & PLAN:   1. Other closed fracture of proximal end of right fibula, initial encounter   2. Osteopenia of lower leg, unspecified laterality    PLAN: Fracture is healing as expected.  Overall this should do well and she has an upcoming trip to the [place].  Recommend increasing her daily aspirin dose to 81 mg twice daily.  Osteopenia DEXA: October 2018 right femur -2.2 = osteopenia  Recommended to continue weightbearing activities, 1200 mg calcium and vitamin D OTC 1000 international units daily.    Fracture  of proximal fibula does give her diagnostic criteria for osteoporosis from fragility fracture.  Would likely benefit from antiresorptive therapy and this will need to be discussed at the 27-month mark following her acute injury.  consider Prolia   ++++++++++++++++++++++++++++++++++++++++++++ Orders & Meds: No orders of the defined types were placed in this encounter.   No orders of the defined types were placed in this encounter.   ++++++++++++++++++++++++++++++++++++++++++++ Follow-up: As scheduled at the beginning of February.  Pertinent documentation may be included in additional procedure notes, imaging studies, problem based documentation and patient instructions. Please see these sections of the encounter for additional information regarding this visit. CMA/ATC served as Education administrator during this visit. History, Physical, and Plan performed by medical provider. Documentation and orders reviewed and attested to.      Gerda Diss, [facility] Physician

## 2017-03-09 NOTE — Assessment & Plan Note (Signed)
DEXA: October 2018 right femur -2.2 = osteopenia  Recommended to continue weightbearing activities, 1200 mg calcium and vitamin D OTC 1000 international units daily.    Fracture of proximal fibula does give her diagnostic criteria for osteoporosis from fragility fracture.  Would likely benefit from antiresorptive therapy and this will need to be discussed at the 81-month mark following her acute injury.  consider Prolia

## 2017-03-13 ENCOUNTER — Encounter: Payer: Self-pay | Admitting: Family Medicine

## 2017-03-19 ENCOUNTER — Encounter: Payer: Self-pay | Admitting: Sports Medicine

## 2017-03-19 NOTE — Procedures (Signed)
LIMITED MSK ULTRASOUND OF right leg Images were obtained and interpreted by myself, Teresa Coombs, DO  Images have been saved and stored to PACS system. Images obtained on: GE S7 Ultrasound machine  FINDINGS:   No significant effusion at this time.  Subcutaneous edema within the posterior calf and popliteal space.  Veins are easily compressible.  There is good Doppler flow at the popliteal vessel.  Slight disruption of the fibrillar structure of the lateral gastroc consistent with a lateral gastroc strain  IMPRESSION:  1. Lateral gastroc strain/tear likely secondary to fibular head fracture 2. Easily compressible veins with good Doppler flow reassuring for no DVT.

## 2017-03-26 ENCOUNTER — Ambulatory Visit: Payer: Medicare Other | Admitting: Sports Medicine

## 2017-03-29 ENCOUNTER — Ambulatory Visit (INDEPENDENT_AMBULATORY_CARE_PROVIDER_SITE_OTHER): Payer: Medicare Other | Admitting: Sports Medicine

## 2017-03-29 ENCOUNTER — Encounter: Payer: Self-pay | Admitting: Sports Medicine

## 2017-03-29 ENCOUNTER — Ambulatory Visit (INDEPENDENT_AMBULATORY_CARE_PROVIDER_SITE_OTHER): Payer: Medicare Other

## 2017-03-29 VITALS — BP 126/80 | HR 80 | Ht 67.0 in | Wt 159.2 lb

## 2017-03-29 DIAGNOSIS — S82831D Other fracture of upper and lower end of right fibula, subsequent encounter for closed fracture with routine healing: Secondary | ICD-10-CM | POA: Diagnosis not present

## 2017-03-29 DIAGNOSIS — M85869 Other specified disorders of bone density and structure, unspecified lower leg: Secondary | ICD-10-CM

## 2017-03-29 DIAGNOSIS — S82831A Other fracture of upper and lower end of right fibula, initial encounter for closed fracture: Secondary | ICD-10-CM | POA: Diagnosis not present

## 2017-03-29 DIAGNOSIS — S82491A Other fracture of shaft of right fibula, initial encounter for closed fracture: Secondary | ICD-10-CM | POA: Diagnosis not present

## 2017-03-29 NOTE — Progress Notes (Signed)
  Taylor Kelley. Taylor Kelley, [name] at [place]  Taylor Kelley - 77 y.o. female MRN [id number]  Date of birth: 1940-05-31  Visit Date: 03/29/2017  PCP: Lucretia Kern, DO   Referred by: Eulas Post, MD   Scribe for today's visit: Wendy Poet, LAT, ATC     SUBJECTIVE:  Taylor Kelley is here for Follow-up (R knee pain and proximal R fibular fx) .   Compared to the last office visit on 03/09/17, her previously described R knee symptoms are improving w/ less pain in her R knee and decreased R knee swelling.  Biggest c/o remains R medial calf pain. Current symptoms are mild & are nonradiating She has been using a knee compression sleeve and taking Aspirin.  Just returned from a cruise.   ROS Denies night time disturbances. Reports fevers, chills, or night sweats.  Yes to night sweats. Denies unexplained weight loss. Denies personal history of cancer. Denies changes in bowel or bladder habits. Denies recent unreported falls. Denies new or worsening dyspnea or wheezing. Denies headaches or dizziness.  Reports numbness, tingling or weakness  In the extremities.  Denies dizziness or presyncopal episodes Denies lower extremity edema     HISTORY & PERTINENT PRIOR DATA:  Prior History reviewed and updated per electronic medical record.  Significant/pertinent history, findings, studies include:  reports that she quit smoking about 38 years ago. Her smoking use included cigarettes. She has a 10.00 pack-year smoking history. She has never used smokeless tobacco. No results for input(s): HGBA1C, LABURIC, CREATINE in the last 8760 hours. No specialty comments available. No problems updated.  OBJECTIVE:  VS:  HT:5\' 7"  (170.2 cm)   WT:159 lb 3.2 oz (72.2 kg)  BMI:24.93    BP:126/80  HR:80bpm  TEMP: ( )  RESP:95 %   PHYSICAL EXAM: Constitutional: WDWN, Non-toxic appearing. Psychiatric: Alert & appropriately interactive.  Not  depressed or anxious appearing. Respiratory: No increased work of breathing.  Trachea Midline Eyes: Pupils are equal.  EOM intact without nystagmus.  No scleral icterus  Vascular Exam: warm to touch no edema  lower extremity neuro exam: unremarkable normal strength normal sensation normal reflexes  MSK Exam: Small amount of pain with palpation of the posterior calf.   ASSESSMENT & PLAN:   1. Other closed fracture of proximal end of right fibula with routine healing, subsequent encounter   2. Other closed fracture of proximal end of right fibula, initial encounter   3. Osteopenia of lower leg, unspecified laterality     PLAN: Overall doing great she will begin to progress her activities as tolerated.   Knee brace recommended for PRN use.  Follow-up: Return in about 6 weeks (around 05/10/2017).      Please see additional documentation for Objective, Assessment and Plan sections. Pertinent additional documentation may be included in corresponding procedure notes, imaging studies, problem based documentation and patient instructions. Please see these sections of the encounter for additional information regarding this visit.  CMA/ATC served as Education administrator during this visit. History, Physical, and Plan performed by medical provider. Documentation and orders reviewed and attested to.      Gerda Diss, [facility] Physician

## 2017-05-10 ENCOUNTER — Encounter: Payer: Self-pay | Admitting: Sports Medicine

## 2017-05-10 ENCOUNTER — Ambulatory Visit (INDEPENDENT_AMBULATORY_CARE_PROVIDER_SITE_OTHER): Payer: Medicare Other | Admitting: Sports Medicine

## 2017-05-10 VITALS — BP 106/74 | HR 84 | Ht 67.0 in | Wt 159.6 lb

## 2017-05-10 DIAGNOSIS — S82831D Other fracture of upper and lower end of right fibula, subsequent encounter for closed fracture with routine healing: Secondary | ICD-10-CM | POA: Diagnosis not present

## 2017-05-10 NOTE — Progress Notes (Signed)
  Taylor Kelley - 77 y.o. female MRN [id number]  Date of birth: 1940-05-24  Scribe for today's visit: Josepha Pigg, CMA     SUBJECTIVE:  Taylor Kelley is here for Follow-up (proximal tibial fx)  03/29/17: Compared to the last office visit on 03/09/17, her previously described R knee symptoms are improving w/ less pain in her R knee and decreased R knee swelling.  Biggest c/o remains R medial calf pain. Current symptoms are mild & are nonradiating She has been using a knee compression sleeve and taking Aspirin.  Just returned from a cruise.  05/10/17: Compared to the last office visit, her previously described symptoms are improving, she is able to go up and down stairs with no pain.  Current symptoms are mild & are nonradiating She has not been taking anything for pain and she is no longer using compression.   ROS Denies night time disturbances. Reports night sweats. Denies unexplained weight loss. Denies personal history of cancer. Denies changes in bowel or bladder habits. Denies recent unreported falls. Denies new or worsening dyspnea or wheezing. Denies headaches or dizziness.  Reports numbness, tingling or weakness  In the extremities.  Denies dizziness or presyncopal episodes Denies lower extremity edema      Please see additional documentation for Objective, Assessment and Plan sections. Pertinent additional documentation may be included in corresponding procedure notes, imaging studies, problem based documentation and patient instructions. Please see these sections of the encounter for additional information regarding this visit.  CMA/ATC served as Education administrator during this visit. History, Physical, and Plan performed by medical provider. Documentation and orders reviewed and attested to.      Gerda Diss, [facility] Physician

## 2017-05-10 NOTE — Progress Notes (Signed)
   [name]. Mikhaila Roh, [place] at [facility] [phone]  Taylor Kelley - 77 y.o. female MRN [id number]  Date of birth: [birth year]  Visit Date:   PCP: Lucretia Kern, DO   Referred by: Lucretia Kern, DO  Please see additional documentation for HPI, review of systems.  HISTORY & PERTINENT PRIOR DATA:  Prior History reviewed and updated per electronic medical record.  Significant history, findings, studies and interim changes include:  reports that she quit smoking about 38 years ago. Her smoking use included cigarettes. She has a 10.00 pack-year smoking history. She has never used smokeless tobacco. No results for input(s): HGBA1C, LABURIC, CREATINE in the last 8760 hours. No specialty comments available. No problems updated.  OBJECTIVE:  VS:  HT:5\' 7"  (170.2 cm)   WT:159 lb 9.6 oz (72.4 kg)  BMI:24.99    BP:106/74  HR:84bpm  TEMP: ( )  RESP:97 %   PHYSICAL EXAM: Constitutional: WDWN, Non-toxic appearing. Psychiatric: Alert & appropriately interactive.  Not depressed or anxious appearing. Respiratory: No increased work of breathing.  Trachea Midline Eyes: Pupils are equal.  EOM intact without nystagmus.  No scleral icterus  NEUROVASCULAR exam: No clubbing or cyanosis appreciated No significant venous stasis changes Capillary Refill: normal, less than 2 seconds  BLE well aligned.  Prominence of intramuscular septum of gastrocs without pain over the medial or lateral head.  No pain over the fibular head.  Normal ankle dorsiflexion/plantar flexion   ASSESSMENT & PLAN:   1. Other closed fracture of proximal end of right fibula with routine healing, subsequent encounter    Orders & Meds: No orders of the defined types were placed in this encounter.  No orders of the defined types were placed in this encounter.   PLAN:  Doing great.  Will have her continue Heel Drop exercise and general strenghtening F/u at needed        No  problem-specific Assessment & Plan notes found for this encounter.   Follow-up: Return for as needed for ongoing issues.

## 2017-05-19 ENCOUNTER — Encounter: Payer: Self-pay | Admitting: Sports Medicine

## 2017-05-24 ENCOUNTER — Encounter: Payer: Self-pay | Admitting: Family Medicine

## 2017-05-24 ENCOUNTER — Ambulatory Visit (INDEPENDENT_AMBULATORY_CARE_PROVIDER_SITE_OTHER): Payer: Medicare Other | Admitting: Family Medicine

## 2017-05-24 VITALS — BP 138/80 | HR 79 | Temp 98.2°F | Ht 67.0 in | Wt 161.5 lb

## 2017-05-24 DIAGNOSIS — R300 Dysuria: Secondary | ICD-10-CM | POA: Diagnosis not present

## 2017-05-24 LAB — POCT URINALYSIS DIPSTICK
BILIRUBIN UA: NEGATIVE
Glucose, UA: NEGATIVE
KETONES UA: NEGATIVE
Nitrite, UA: NEGATIVE
PH UA: 6 (ref 5.0–8.0)
SPEC GRAV UA: 1.025 (ref 1.010–1.025)
UROBILINOGEN UA: 0.2 U/dL

## 2017-05-24 MED ORDER — NITROFURANTOIN MONOHYD MACRO 100 MG PO CAPS: 100.0000 mg | ORAL_CAPSULE | Freq: Two times a day (BID) | ORAL | 0 refills | Status: DC

## 2017-05-24 NOTE — Patient Instructions (Signed)
BEFORE YOU LEAVE: -urine culture  Take the antibiotic (macrobid) as prescribe.  I hope you are feeling better soon! Seek care promptly if your symptoms worsen, new concerns arise or you are not improving with treatment.

## 2017-05-24 NOTE — Progress Notes (Signed)
  HPI:  Using dictation device. Unfortunately this device frequently misinterprets words/phrases.  Acute visit for Dysuria: -started 5 days ago -dysuria, odor -denies:fevers, flank pain, hematuria, NV -reports hx prior UTI and feels the same -has tried drinking water and cranberry juice  ROS: See pertinent positives and negatives per HPI.  Past Medical History:  Diagnosis Date  . Allergy   . GERD (gastroesophageal reflux disease)   . Heart murmur   . Osteopenia  04/2013   T score -1.7 FRAX 11%/2.1%  . Thyroid disease   . Urine incontinence   . UTI (urinary tract infection)     Past Surgical History:  Procedure Laterality Date  . BREAST BIOPSY Left    fibroadenoma  . ENDOMETRIAL BIOPSY  2009   polyp    Family History  Problem Relation Age of Onset  . Heart disease Mother   . Stroke Mother   . Hypertension Mother   . Heart disease Father   . Breast cancer Other 50  . Breast cancer Sister 78  . Cancer Paternal Grandfather     SOCIAL HX: no reported change   Current Outpatient Medications:  .  ARMOUR THYROID 60 MG tablet, TAKE ONE TABLET BY MOUTH EVERY DAY, Disp: 90 tablet, Rfl: 1 .  aspirin 81 MG chewable tablet, Chew 81 mg by mouth daily., Disp: , Rfl:  .  CALCIUM PO, Take by mouth., Disp: , Rfl:  .  Cholecalciferol (VITAMIN D PO), Take by mouth., Disp: , Rfl:  .  Esomeprazole Magnesium (NEXIUM PO), Take 22 mg by mouth., Disp: , Rfl:  .  levocetirizine (XYZAL) 5 MG tablet, Take 5 mg by mouth every evening., Disp: , Rfl:  .  traZODone (DESYREL) 150 MG tablet, TAKE ONE TABLET AT BEDTIME, Disp: 90 tablet, Rfl: 3 .  nitrofurantoin, macrocrystal-monohydrate, (MACROBID) 100 MG capsule, Take 1 capsule (100 mg total) by mouth 2 (two) times daily., Disp: 14 capsule, Rfl: 0  EXAM:  Vitals:   05/24/17 1337  BP: 138/80  Pulse: 79  Temp: 98.2 F (36.8 C)    Body mass index is 25.29 kg/m.  GENERAL: vitals reviewed and listed above, alert, oriented, appears well  hydrated and in no acute distress  HEENT: atraumatic, conjunttiva clear, no obvious abnormalities on inspection of external nose and ears  NECK: no obvious masses on inspection  LUNGS: clear to auscultation bilaterally, no wheezes, rales or rhonchi, good air movement  CV: HRRR, no peripheral edema  MS: moves all extremities without noticeable abnormality  ABD: BS+, soft, NTTP, no CVA TTP  PSYCH: pleasant and cooperative, no obvious depression or anxiety  ASSESSMENT AND PLAN:  Discussed the following assessment and plan:  Dysuria - Plan: POC Urinalysis Dipstick, Culture, Urine  -udip + symptoms c/w likely UTI -she opted to start abx after discussion - macrobid -culture pending -Patient advised to return or notify a doctor immediately if symptoms worsen or persist or new concerns arise.  Patient Instructions  BEFORE YOU LEAVE: -urine culture  Take the antibiotic (macrobid) as prescribe.  I hope you are feeling better soon! Seek care promptly if your symptoms worsen, new concerns arise or you are not improving with treatment.      Lucretia Kern, DO

## 2017-05-26 LAB — URINE CULTURE
MICRO NUMBER: 90417815
SPECIMEN QUALITY: ADEQUATE

## 2017-07-12 ENCOUNTER — Ambulatory Visit: Payer: Medicare Other

## 2017-07-16 NOTE — Progress Notes (Addendum)
Subjective:   Taylor Kelley is a 77 y.o. female who presents for Medicare Annual (Subsequent) preventive examination.  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 Paulla Fore feels her fibula broke and she fell  Per Dr. Paulla Fore, toe stepping through out the day for exercise   ETOH; 5 nights a week Tobacco; pack a day x 20 years;  Quit 1980   There are no preventive care reminders to display for this patient. PSV 23; thinks she had at pharmacy Will postpone and she will get her date  Educated shingrix  Mammogram- 11/2016 ; [facility]   DEXA - 11/2016- [facility] did DEXA; Score -2.2     Cologuard 06/22/2016 - neg   Cardiac Risk Factors include: advanced age (>69men, >24 women);hypertension;obesity (BMI >30kg/m2)     Objective:     Vitals: BP 136/70   Pulse 72   Ht 5\' 7"  (1.702 m)   Wt 159 lb (72.1 kg)   SpO2 95%   BMI 24.90 kg/m   Body mass index is 24.9 kg/m.  Advanced Directives 07/17/2017 07/11/2016  Does Patient Have a Medical Advance Directive? Yes Yes    Tobacco Social History   Tobacco Use  Smoking Status Former Smoker  . Packs/day: 1.00  . Years: 20.00  . Pack years: 20.00  . Types: Cigarettes  . Last attempt to quit: 06/14/1978  . Years since quitting: 39.1  Smokeless Tobacco Never Used     Counseling given: Yes   Clinical Intake:   Past Medical  History:  Diagnosis Date  . Allergy   . GERD (gastroesophageal reflux disease)   . Heart murmur   . Osteopenia  04/2013   T score -1.7 FRAX 11%/2.1%  . Thyroid disease   . Urine incontinence   . UTI (urinary tract infection)    Past Surgical History:  Procedure Laterality Date  . BREAST BIOPSY Left    fibroadenoma  . ENDOMETRIAL BIOPSY  2009   polyp   Family History  Problem Relation Age of Onset  . Heart disease Mother   . Stroke Mother   . Hypertension Mother   . Heart disease Father   . Breast cancer Other 50  . Breast cancer Sister 31  . Cancer Paternal Grandfather    Social History   Socioeconomic History  . Marital status: Married    Spouse name: Not on file  . Number of children: Not on file  . Years of education: Not on file  . Highest education level: Not on file  Occupational History  . Not on file  Social Needs  . Financial resource strain: Not on file  . Food insecurity:    Worry: Not on file  Inability: Not on file  . Transportation needs:    Medical: Not on file    Non-medical: Not on file  Tobacco Use  . Smoking status: Former Smoker    Packs/day: 1.00    Years: 20.00    Pack years: 20.00    Types: Cigarettes    Last attempt to quit: 06/14/1978    Years since quitting: 39.1  . Smokeless tobacco: Never Used  Substance and Sexual Activity  . Alcohol use: Yes    Alcohol/week: 3.5 oz    Types: 7 Standard drinks or equivalent per week    Comment: [place] roughly 5 days a week   . Drug use: No  . Sexual activity: Yes    Birth control/protection: Post-menopausal  Lifestyle  . Physical activity:    Days per week: Not on file    Minutes per session: Not on file  . Stress: Not on file  Relationships  . Social connections:    Talks on phone: Not on file    Gets together: Not on file    Attends religious service: Not on file    Active member of club or organization: Not on file    Attends meetings of clubs or organizations: Not on file     Relationship status: Not on file  Other Topics Concern  . Not on file  Social History Narrative  . Not on file    Outpatient Encounter Medications as of 07/17/2017  Medication Sig  . ARMOUR THYROID 60 MG tablet TAKE ONE TABLET BY MOUTH EVERY DAY  . aspirin 81 MG chewable tablet Chew 81 mg by mouth daily.  Marland Kitchen CALCIUM PO Take by mouth.  . Esomeprazole Magnesium (NEXIUM PO) Take 22 mg by mouth.  . levocetirizine (XYZAL) 5 MG tablet Take 5 mg by mouth every evening.  . traZODone (DESYREL) 150 MG tablet TAKE ONE TABLET AT BEDTIME  . Cholecalciferol (VITAMIN D PO) Take by mouth.  . [DISCONTINUED] nitrofurantoin, macrocrystal-monohydrate, (MACROBID) 100 MG capsule Take 1 capsule (100 mg total) by mouth 2 (two) times daily.   No facility-administered encounter medications on file as of 07/17/2017.     Activities of Daily Living In your present state of health, do you have any difficulty performing the following activities: 07/17/2017  Hearing? Y  Vision? N  Difficulty concentrating or making decisions? N  Walking or climbing stairs? N  Dressing or bathing? N  Doing errands, shopping? N  Preparing Food and eating ? N  Using the Toilet? N  In the past six months, have you accidently leaked urine? N  Do you have problems with loss of bowel control? N  Managing your Medications? N  Managing your Finances? N  Housekeeping or managing your Housekeeping? N  Some recent data might be hidden    Patient Care Team: Lucretia Kern, DO as PCP - General (Family Medicine)    Assessment:   This is a routine wellness examination for Taylor Kelley.  Exercise Activities and Dietary recommendations Current Exercise Habits: Home exercise routine, Type of exercise: walking, Time (Minutes): 60  Goals    . Patient Stated     Have fun and enjoy your life        Fall Risk Fall Risk  07/17/2017 07/11/2016 04/28/2015 04/28/2015 02/23/2014  Falls in the past year? Yes Yes Yes No No  Number falls in past yr: 1 1 1  -  -  Injury with Fall? Yes - No - -  Follow up Education provided - - - -  Depression Screen PHQ 2/9 Scores 07/17/2017 07/11/2016 04/28/2015 04/28/2015  PHQ - 2 Score 0 0 0 0     Cognitive Function MMSE - Mini Mental State Exam 07/11/2016  Not completed: (No Data)    Ad8 score 0 Does crosswords Loves puzzles     Immunization History  Administered Date(s) Administered  . Influenza Split 01/02/2012, 12/23/2012  . Influenza, High Dose Seasonal PF 01/11/2015  . Influenza,inj,Quad PF,6+ Mos 02/23/2014  . Pneumococcal Conjugate-13 04/28/2015    Screening Tests Health Maintenance  Topic Date Due  . PNA vac Low Risk Adult (2 of 2 - PPSV23) 07/18/2018 (Originally 04/27/2016)  . INFLUENZA VACCINE  09/20/2017  . TETANUS/TDAP  02/03/2020  . DEXA SCAN  Completed        Plan:      PCP Notes   Health Maintenance Educated regarding the shingrix  Discussed dexa in lieu of Dr. 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 Maudie Mercury to discuss   Osteopenia; -2.2  Dr. Paulla Fore felt she may have had a fx to fibula and then she fell.' She fell while going down stairs   Black spot on right upper abd that Dr. Maudie Mercury needs to check as well   Nurse Concerns; As noted  Next PCP apt 30 min OV apt to be made with Dr. Tretha Sciara made for 7/22    I have personally reviewed and noted the following in the patient's chart:   . Medical and social history . Use of alcohol, tobacco or illicit drugs  . Current medications and supplements . Functional ability and status . Nutritional status . Physical activity . Advanced directives . List of other physicians . Hospitalizations, surgeries, and ER  visits in previous 12 months . Vitals . Screenings to include cognitive, depression, and falls . Referrals and appointments  In addition, I have reviewed and discussed with patient certain preventive protocols, quality metrics, and best practice recommendations. A written personalized care plan for preventive services as well as general preventive health recommendations were provided to patient.     Wynetta Fines, RN  07/17/2017   5/31 call back to Taylor Kelley this evening but lines were busy., Will try again next week Wynetta Fines RN   06/04 call back to Taylor Kelley and she agreed to come in to review symptoms noted above 6/6/ at 4:45; [facility] RN

## 2017-07-17 ENCOUNTER — Ambulatory Visit (INDEPENDENT_AMBULATORY_CARE_PROVIDER_SITE_OTHER): Payer: Medicare Other

## 2017-07-17 VITALS — BP 136/70 | HR 72 | Ht 67.0 in | Wt 159.0 lb

## 2017-07-17 DIAGNOSIS — Z0000 Encounter for general adult medical examination without abnormal findings: Secondary | ICD-10-CM | POA: Diagnosis not present

## 2017-07-17 NOTE — Progress Notes (Signed)
Taylor Kern, DO [name], does she need visit sooner then July for her concerns? Thanks.

## 2017-07-17 NOTE — Patient Instructions (Addendum)
Ms. Bjorn , Thank you for taking time to come for your Medicare Wellness Visit. I appreciate your ongoing commitment to your health goals. Please review the following plan we discussed and let me know if I can assist you in the future.   Will make an apt with Dr. Maudie Mercury to fup on osteopenia;and thyroid   The Centers for Disease Control are now recommending 2 pneumonia vaccinations after 73. The first is the Prevnar 13. This helps to boost your immunity to community acquired pneumonia as well as some protection from bacterial pneumonia This was given April 28, 2015   The 2nd is the pneumovax 23, which offers more broad protection!  Please consider taking these as this is your best protection against pneumonia. Check with the pharmacist regarding when you had the PSV 23   Recommendations for Dexa Scan Female over the age of 53 Man age 95 or older If you broke a bone past the age of 30 Women menopausal age with risk factors (thin frame; smoker; hx of fx ) Post menopausal women under the age of 56 with risk factors A man age 74 to 26 with risk factors Other: Spine xray that is showing break of bone loss Back pain with possible break Height loss of 1/2 inch or more within one year Total loss in height of 1.5 inches from your original height  Calcium 122m with Vit D 800u per day; more as directed by physician Strength building exercises discussed; can include walking; housework; small weights or stretch bands; silver sneakers if access to the [facility]  Please visit the osteoporosis foundation.org for up to date recommendations Your bone density -2.2 t score and may review the site for discussion with Dr. KMaudie Mercuryin lieu for Dr. RNicolasa Duckingcomments  Shingrix is a vaccine for the prevention of Shingles in Adults 565and older.  If you are on Medicare, the shingrix is covered under your Part D plan, so you will take both of the vaccines in the series at your pharmacy. Please check with your benefits  regarding applicable copays or out of pocket expenses.  The Shingrix is given in 2 vaccines approx 8 weeks apart. You must receive the 2nd dose prior to 6 months from receipt of the first. Please have the pharmacist print out you Immunization  dates for our office records       These are the goals we discussed: Goals    . Patient Stated     Have fun and enjoy your life        This is a list of the screening recommended for you and due dates:  Health Maintenance  Topic Date Due  . Pneumonia vaccines (2 of 2 - PPSV23) 04/27/2016  . Flu Shot  09/20/2017  . Tetanus Vaccine  02/03/2020  . DEXA scan (bone density measurement)  Completed      Fall Prevention in the Home Falls can cause injuries. They can happen to people of all ages. There are many things you can do to make your home safe and to help prevent falls. What can I do on the outside of my home?  Regularly fix the edges of walkways and driveways and fix any cracks.  Remove anything that might make you trip as you walk through a door, such as a raised step or threshold.  Trim any bushes or trees on the path to your home.  Use bright outdoor lighting.  Clear any walking paths of anything that might make someone trip,  such as rocks or tools.  Regularly check to see if handrails are loose or broken. Make sure that both sides of any steps have handrails.  Any raised decks and porches should have guardrails on the edges.  Have any leaves, snow, or ice cleared regularly.  Use sand or salt on walking paths during winter.  Clean up any spills in your garage right away. This includes oil or grease spills. What can I do in the bathroom?  Use night lights.  Install grab bars by the toilet and in the tub and shower. Do not use towel bars as grab bars.  Use non-skid mats or decals in the tub or shower.  If you need to sit down in the shower, use a plastic, non-slip stool.  Keep the floor dry. Clean up any water that  spills on the floor as soon as it happens.  Remove soap buildup in the tub or shower regularly.  Attach bath mats securely with double-sided non-slip rug tape.  Do not have throw rugs and other things on the floor that can make you trip. What can I do in the bedroom?  Use night lights.  Make sure that you have a light by your bed that is easy to reach.  Do not use any sheets or blankets that are too big for your bed. They should not hang down onto the floor.  Have a firm chair that has side arms. You can use this for support while you get dressed.  Do not have throw rugs and other things on the floor that can make you trip. What can I do in the kitchen?  Clean up any spills right away.  Avoid walking on wet floors.  Keep items that you use a lot in easy-to-reach places.  If you need to reach something above you, use a strong step stool that has a grab bar.  Keep electrical cords out of the way.  Do not use floor polish or wax that makes floors slippery. If you must use wax, use non-skid floor wax.  Do not have throw rugs and other things on the floor that can make you trip. What can I do with my stairs?  Do not leave any items on the stairs.  Make sure that there are handrails on both sides of the stairs and use them. Fix handrails that are broken or loose. Make sure that handrails are as long as the stairways.  Check any carpeting to make sure that it is firmly attached to the stairs. Fix any carpet that is loose or worn.  Avoid having throw rugs at the top or bottom of the stairs. If you do have throw rugs, attach them to the floor with carpet tape.  Make sure that you have a light switch at the top of the stairs and the bottom of the stairs. If you do not have them, ask someone to add them for you. What else can I do to help prevent falls?  Wear shoes that: ? Do not have high heels. ? Have rubber bottoms. ? Are comfortable and fit you well. ? Are closed at the  toe. Do not wear sandals.  If you use a stepladder: ? Make sure that it is fully opened. Do not climb a closed stepladder. ? Make sure that both sides of the stepladder are locked into place. ? Ask someone to hold it for you, if possible.  Clearly mark and make sure that you can see: ? Any grab  bars or handrails. ? First and last steps. ? Where the edge of each step is.  Use tools that help you move around (mobility aids) if they are needed. These include: ? Canes. ? Walkers. ? Scooters. ? Crutches.  Turn on the lights when you go into a dark area. Replace any light bulbs as soon as they burn out.  Set up your furniture so you have a clear path. Avoid moving your furniture around.  If any of your floors are uneven, fix them.  If there are any pets around you, be aware of where they are.  Review your medicines with your doctor. Some medicines can make you feel dizzy. This can increase your chance of falling. Ask your doctor what other things that you can do to help prevent falls. This information is not intended to replace advice given to you by your health care provider. Make sure you discuss any questions you have with your health care provider. Document Released: 12/03/2008 Document Revised: 07/15/2015 Document Reviewed: 03/13/2014 Elsevier Interactive Patient Education  2018 [place] Maintenance, Female Adopting a healthy lifestyle and getting preventive care can go a long way to promote health and wellness. Talk with your health care provider about what schedule of regular examinations is right for you. This is a good chance for you to check in with your provider about disease prevention and staying healthy. In between checkups, there are plenty of things you can do on your own. Experts have done a lot of research about which lifestyle changes and preventive measures are most likely to keep you healthy. Ask your health care provider for more information. Weight  and diet Eat a healthy diet  Be sure to include plenty of vegetables, fruits, low-fat dairy products, and lean protein.  Do not eat a lot of foods high in solid fats, added sugars, or salt.  Get regular exercise. This is one of the most important things you can do for your health. ? Most adults should exercise for at least 150 minutes each week. The exercise should increase your heart rate and make you sweat (moderate-intensity exercise). ? Most adults should also do strengthening exercises at least twice a week. This is in addition to the moderate-intensity exercise.  Maintain a healthy weight  Body mass index (BMI) is a measurement that can be used to identify possible weight problems. It estimates body fat based on height and weight. Your health care provider can help determine your BMI and help you achieve or maintain a healthy weight.  For females 11 years of age and older: ? A BMI below 18.5 is considered underweight. ? A BMI of 18.5 to 24.9 is normal. ? A BMI of 25 to 29.9 is considered overweight. ? A BMI of 30 and above is considered obese.  Watch levels of cholesterol and blood lipids  You should start having your blood tested for lipids and cholesterol at 76 years of age, then have this test every 5 years.  You may need to have your cholesterol levels checked more often if: ? Your lipid or cholesterol levels are high. ? You are older than 76 years of age. ? You are at high risk for heart disease.  Cancer screening Lung Cancer  Lung cancer screening is recommended for adults 24-77 years old who are at high risk for lung cancer because of a history of smoking.  A yearly low-dose CT scan of the lungs is recommended for people who: ? Currently smoke. ?  Have quit within the past 15 years. ? Have at least a 30-pack-year history of smoking. A pack year is smoking an average of one pack of cigarettes a day for 1 year.  Yearly screening should continue until it has been 15  years since you quit.  Yearly screening should stop if you develop a health problem that would prevent you from having lung cancer treatment.  Breast Cancer  Practice breast self-awareness. This means understanding how your breasts normally appear and feel.  It also means doing regular breast self-exams. Let your health care provider know about any changes, no matter how small.  If you are in your 20s or 30s, you should have a clinical breast exam (CBE) by a health care provider every 1-3 years as part of a regular health exam.  If you are 77 or older, have a CBE every year. Also consider having a breast X-ray (mammogram) every year.  If you have a family history of breast cancer, talk to your health care provider about genetic screening.  If you are at high risk for breast cancer, talk to your health care provider about having an MRI and a mammogram every year.  Breast cancer gene (BRCA) assessment is recommended for women who have family members with BRCA-related cancers. BRCA-related cancers include: ? Breast. ? Ovarian. ? Tubal. ? Peritoneal cancers.  Results of the assessment will determine the need for genetic counseling and BRCA1 and BRCA2 testing.  Cervical Cancer Your health care provider may recommend that you be screened regularly for cancer of the pelvic organs (ovaries, uterus, and vagina). This screening involves a pelvic examination, including checking for microscopic changes to the surface of your cervix (Pap test). You may be encouraged to have this screening done every 3 years, beginning at age 15.  For women ages 71-65, health care providers may recommend pelvic exams and Pap testing every 3 years, or they may recommend the Pap and pelvic exam, combined with testing for human papilloma virus (HPV), every 5 years. Some types of HPV increase your risk of cervical cancer. Testing for HPV may also be done on women of any age with unclear Pap test results.  Other health  care providers may not recommend any screening for nonpregnant women who are considered low risk for pelvic cancer and who do not have symptoms. Ask your health care provider if a screening pelvic exam is right for you.  If you have had past treatment for cervical cancer or a condition that could lead to cancer, you need Pap tests and screening for cancer for at least 20 years after your treatment. If Pap tests have been discontinued, your risk factors (such as having a new sexual partner) need to be reassessed to determine if screening should resume. Some women have medical problems that increase the chance of getting cervical cancer. In these cases, your health care provider may recommend more frequent screening and Pap tests.  Colorectal Cancer  This type of cancer can be detected and often prevented.  Routine colorectal cancer screening usually begins at 76 years of age and continues through 76 years of age.  Your health care provider may recommend screening at an earlier age if you have risk factors for colon cancer.  Your health care provider may also recommend using home test kits to check for hidden blood in the stool.  A small camera at the end of a tube can be used to examine your colon directly (sigmoidoscopy or colonoscopy). This is done  to check for the earliest forms of colorectal cancer.  Routine screening usually begins at age 77.  Direct examination of the colon should be repeated every 5-10 years through 76 years of age. However, you may need to be screened more often if early forms of precancerous polyps or small growths are found.  Skin Cancer  Check your skin from head to toe regularly.  Tell your health care provider about any new moles or changes in moles, especially if there is a change in a mole's shape or color.  Also tell your health care provider if you have a mole that is larger than the size of a pencil eraser.  Always use sunscreen. Apply sunscreen liberally  and repeatedly throughout the day.  Protect yourself by wearing long sleeves, pants, a wide-brimmed hat, and sunglasses whenever you are outside.  Heart disease, diabetes, and high blood pressure  High blood pressure causes heart disease and increases the risk of stroke. High blood pressure is more likely to develop in: ? People who have blood pressure in the high end of the normal range (130-139/85-89 mm Hg). ? People who are overweight or obese. ? People who are African American.  If you are 74-76 years of age, have your blood pressure checked every 3-5 years. If you are 40 years of age or older, have your blood pressure checked every year. You should have your blood pressure measured twice-once when you are at a hospital or clinic, and once when you are not at a hospital or clinic. Record the average of the two measurements. To check your blood pressure when you are not at a hospital or clinic, you can use: ? An automated blood pressure machine at a pharmacy. ? A home blood pressure monitor.  If you are between 68 years and 42 years old, ask your health care provider if you should take aspirin to prevent strokes.  Have regular diabetes screenings. This involves taking a blood sample to check your fasting blood sugar level. ? If you are at a normal weight and have a low risk for diabetes, have this test once every three years after 76 years of age. ? If you are overweight and have a high risk for diabetes, consider being tested at a younger age or more often. Preventing infection Hepatitis B  If you have a higher risk for hepatitis B, you should be screened for this virus. You are considered at high risk for hepatitis B if: ? You were born in a country where hepatitis B is common. Ask your health care provider which countries are considered high risk. ? Your parents were born in a high-risk country, and you have not been immunized against hepatitis B (hepatitis B vaccine). ? You have HIV  or AIDS. ? You use needles to inject street drugs. ? You live with someone who has hepatitis B. ? You have had sex with someone who has hepatitis B. ? You get hemodialysis treatment. ? You take certain medicines for conditions, including cancer, organ transplantation, and autoimmune conditions.  Hepatitis C  Blood testing is recommended for: ? Everyone born from [birth year] through 1964. ? Anyone with known risk factors for hepatitis C.  Sexually transmitted infections (STIs)  You should be screened for sexually transmitted infections (STIs) including gonorrhea and chlamydia if: ? You are sexually active and are younger than 76 years of age. ? You are older than 76 years of age and your health care provider tells you that you are at  risk for this type of infection. ? Your sexual activity has changed since you were last screened and you are at an increased risk for chlamydia or gonorrhea. Ask your health care provider if you are at risk.  If you do not have HIV, but are at risk, it may be recommended that you take a prescription medicine daily to prevent HIV infection. This is called pre-exposure prophylaxis (PrEP). You are considered at risk if: ? You are sexually active and do not regularly use condoms or know the HIV status of your partner(s). ? You take drugs by injection. ? You are sexually active with a partner who has HIV.  Talk with your health care provider about whether you are at high risk of being infected with HIV. If you choose to begin PrEP, you should first be tested for HIV. You should then be tested every 3 months for as long as you are taking PrEP. Pregnancy  If you are premenopausal and you may become pregnant, ask your health care provider about preconception counseling.  If you may become pregnant, take 400 to 800 micrograms (mcg) of folic acid every day.  If you want to prevent pregnancy, talk to your health care provider about birth control  (contraception). Osteoporosis and menopause  Osteoporosis is a disease in which the bones lose minerals and strength with aging. This can result in serious bone fractures. Your risk for osteoporosis can be identified using a bone density scan.  If you are 47 years of age or older, or if you are at risk for osteoporosis and fractures, ask your health care provider if you should be screened.  Ask your health care provider whether you should take a calcium or vitamin D supplement to lower your risk for osteoporosis.  Menopause may have certain physical symptoms and risks.  Hormone replacement therapy may reduce some of these symptoms and risks. Talk to your health care provider about whether hormone replacement therapy is right for you. Follow these instructions at home:  Schedule regular health, dental, and eye exams.  Stay current with your immunizations.  Do not use any tobacco products including cigarettes, chewing tobacco, or electronic cigarettes.  If you are pregnant, do not drink alcohol.  If you are breastfeeding, limit how much and how often you drink alcohol.  Limit alcohol intake to no more than 1 drink per day for nonpregnant women. One drink equals 12 ounces of beer, 5 ounces of wine, or 1 ounces of hard liquor.  Do not use street drugs.  Do not share needles.  Ask your health care provider for help if you need support or information about quitting drugs.  Tell your health care provider if you often feel depressed.  Tell your health care provider if you have ever been abused or do not feel safe at home. This information is not intended to replace advice given to you by your health care provider. Make sure you discuss any questions you have with your health care provider. Document Released: 08/22/2010 Document Revised: 07/15/2015 Document Reviewed: 11/10/2014 Elsevier Interactive Patient Education  [employer].

## 2017-07-20 ENCOUNTER — Telehealth: Payer: Self-pay | Admitting: Family Medicine

## 2017-07-20 NOTE — Telephone Encounter (Signed)
Copied from [place] [phone]. Topic: Quick Communication - Office Called Patient >> Jul 20, 2017  1:06 PM Synthia Innocent wrote: Reason for CRM: returning call

## 2017-07-23 NOTE — Telephone Encounter (Signed)
I did not call the pt-Taylor Kelley Hearing stated she left a message for the pt to return her call on Friday.

## 2017-07-26 ENCOUNTER — Ambulatory Visit (INDEPENDENT_AMBULATORY_CARE_PROVIDER_SITE_OTHER): Payer: Medicare Other | Admitting: Family Medicine

## 2017-07-26 ENCOUNTER — Encounter: Payer: Self-pay | Admitting: Family Medicine

## 2017-07-26 VITALS — BP 110/72 | HR 74 | Temp 98.7°F | Ht 67.0 in | Wt 160.4 lb

## 2017-07-26 DIAGNOSIS — R252 Cramp and spasm: Secondary | ICD-10-CM

## 2017-07-26 DIAGNOSIS — E039 Hypothyroidism, unspecified: Secondary | ICD-10-CM

## 2017-07-26 DIAGNOSIS — L659 Nonscarring hair loss, unspecified: Secondary | ICD-10-CM | POA: Diagnosis not present

## 2017-07-26 DIAGNOSIS — R635 Abnormal weight gain: Secondary | ICD-10-CM

## 2017-07-26 DIAGNOSIS — L853 Xerosis cutis: Secondary | ICD-10-CM

## 2017-07-26 DIAGNOSIS — L603 Nail dystrophy: Secondary | ICD-10-CM | POA: Diagnosis not present

## 2017-07-26 NOTE — Progress Notes (Signed)
HPI:  Using dictation device. Unfortunately this device frequently misinterprets words/phrases.  Follow up hypothyroidism: -no seen in quit some time by me -did AWV recently -reports progressively worsening hair thinning, brittle nails, dry skin, difficulty losing weight and leg cramps at night for 1-2 years -a topical cream has helped the leg cramps -takes armour thyroid as synthroid cause palpitations per her report -no fevers, malaise, wt loss, palpitations -wants to check thyroid  ROS: See pertinent positives and negatives per HPI.  Past Medical History:  Diagnosis Date  . Allergy   . GERD (gastroesophageal reflux disease)   . Heart murmur   . Osteopenia  04/2013   T score -1.7 FRAX 11%/2.1%  . Thyroid disease   . Urine incontinence   . UTI (urinary tract infection)     Past Surgical History:  Procedure Laterality Date  . BREAST BIOPSY Left    fibroadenoma  . ENDOMETRIAL BIOPSY  2009   polyp    Family History  Problem Relation Age of Onset  . Heart disease Mother   . Stroke Mother   . Hypertension Mother   . Heart disease Father   . Breast cancer Other 50  . Breast cancer Sister 35  . Cancer Paternal Grandfather     SOCIAL HX: see hpi   Current Outpatient Medications:  .  ARMOUR THYROID 60 MG tablet, TAKE ONE TABLET BY MOUTH EVERY DAY, Disp: 90 tablet, Rfl: 1 .  aspirin 81 MG chewable tablet, Chew 81 mg by mouth daily., Disp: , Rfl:  .  CALCIUM PO, Take by mouth., Disp: , Rfl:  .  Cholecalciferol (VITAMIN D PO), Take by mouth., Disp: , Rfl:  .  Esomeprazole Magnesium (NEXIUM PO), Take 22 mg by mouth., Disp: , Rfl:  .  levocetirizine (XYZAL) 5 MG tablet, Take 5 mg by mouth every evening., Disp: , Rfl:  .  Multiple Vitamin (MULTIVITAMIN) capsule, Take 1 capsule by mouth daily., Disp: , Rfl:  .  traZODone (DESYREL) 150 MG tablet, TAKE ONE TABLET AT BEDTIME, Disp: 90 tablet, Rfl: 3  EXAM:  Vitals:   07/26/17 1651  BP: 110/72  Pulse: 74  Temp: 98.7 F  (37.1 C)    Body mass index is 25.12 kg/m.  GENERAL: vitals reviewed and listed above, alert, oriented, appears well hydrated and in no acute distress  HEENT: atraumatic, conjunttiva clear, no obvious abnormalities on inspection of external nose and ears  NECK: no obvious masses on inspection  LUNGS: clear to auscultation bilaterally, no wheezes, rales or rhonchi, good air movement  CV: HRRR, no peripheral edema  SKIN: xerosis  MS: moves all extremities without noticeable abnormality  PSYCH: pleasant and cooperative, no obvious depression or anxiety  ASSESSMENT AND PLAN:  Discussed the following assessment and plan:  Hypothyroidism, unspecified type - Plan: TSH, T4, free  Hair loss  Dry skin  Brittle nails  Leg cramps  Weight gain  -discussed options -check thyroid labs -consider endo or derm referral -consider increasing to 75mg  Armour thyroid -follow up 3 months -Patient advised to return or notify a doctor immediately if symptoms worsen or persist or new concerns arise.  Patient Instructions  BEFORE YOU LEAVE: -labs -follow up: 3 months  We have ordered labs or studies at this visit. It can take up to 1-2 weeks for results and processing. IF results require follow up or explanation, we will call you with instructions. Clinically stable results will be released to your [facility]. If you have not heard from [place] or  cannot find your results in [facility] in 2 weeks please contact our office at [phone].  If you are not yet signed up for [facility], please consider signing up.  Stop aspirin and calcium.  We may consider increasing your dose pending result. Also, could consider seeing a thyroid specialist or Dr. Kemper Durie in [place] regarding the hair/nails.   We recommend the following healthy lifestyle for LIFE: 1) Small portions. But, make sure to get regular (at least 3 per day), healthy meals and small healthy snacks if needed.  2) Eat a healthy clean  diet.   TRY TO EAT: -at least 5-7 servings of low sugar, colorful, and nutrient rich vegetables per day (not corn, potatoes or bananas.) -berries are the best choice if you wish to eat fruit (only eat small amounts if trying to reduce weight)  -lean meets (fish, white meat of chicken or [place]) -vegan proteins for some meals - beans or tofu, whole grains, nuts and seeds -Replace bad fats with good fats - good fats include: fish, nuts and seeds, canola oil, olive oil -small amounts of low fat or non fat dairy -small amounts of100 % whole grains - check the lables -drink plenty of water  AVOID: -SUGAR, sweets, anything with added sugar, corn syrup or sweeteners - must read labels as even foods advertised as "healthy" often are loaded with sugar -if you must have a sweetener, small amounts of stevia may be best -sweetened beverages and artificially sweetened beverages -simple starches (rice, bread, potatoes, pasta, chips, etc - small amounts of 100% whole grains are ok) -red meat, pork, butter -fried foods, fast food, processed food, excessive dairy, eggs and coconut.  3)Get at least 150 minutes of sweaty aerobic exercise per week.  4)Reduce stress - consider counseling, meditation and relaxation to balance other aspects of your life.         Lucretia Kern, DO

## 2017-07-26 NOTE — Patient Instructions (Addendum)
BEFORE YOU LEAVE: -labs -follow up: 3 months  We have ordered labs or studies at this visit. It can take up to 1-2 weeks for results and processing. IF results require follow up or explanation, we will call you with instructions. Clinically stable results will be released to your [facility]. If you have not heard from [place] or cannot find your results in [facility] in 2 weeks please contact our office at [phone].  If you are not yet signed up for [facility], please consider signing up.  Stop aspirin and calcium.  We may consider increasing your dose pending result. Also, could consider seeing a thyroid specialist or Dr. Kemper Durie in [place] regarding the hair/nails.   We recommend the following healthy lifestyle for LIFE: 1) Small portions. But, make sure to get regular (at least 3 per day), healthy meals and small healthy snacks if needed.  2) Eat a healthy clean diet.   TRY TO EAT: -at least 5-7 servings of low sugar, colorful, and nutrient rich vegetables per day (not corn, potatoes or bananas.) -berries are the best choice if you wish to eat fruit (only eat small amounts if trying to reduce weight)  -lean meets (fish, white meat of chicken or [place]) -vegan proteins for some meals - beans or tofu, whole grains, nuts and seeds -Replace bad fats with good fats - good fats include: fish, nuts and seeds, canola oil, olive oil -small amounts of low fat or non fat dairy -small amounts of100 % whole grains - check the lables -drink plenty of water  AVOID: -SUGAR, sweets, anything with added sugar, corn syrup or sweeteners - must read labels as even foods advertised as "healthy" often are loaded with sugar -if you must have a sweetener, small amounts of stevia may be best -sweetened beverages and artificially sweetened beverages -simple starches (rice, bread, potatoes, pasta, chips, etc - small amounts of 100% whole grains are ok) -red meat, pork, butter -fried foods, fast food,  processed food, excessive dairy, eggs and coconut.  3)Get at least 150 minutes of sweaty aerobic exercise per week.  4)Reduce stress - consider counseling, meditation and relaxation to balance other aspects of your life.

## 2017-07-27 LAB — T4, FREE: FREE T4: 0.87 ng/dL (ref 0.60–1.60)

## 2017-07-27 LAB — TSH: TSH: 2.02 u[IU]/mL (ref 0.35–4.50)

## 2017-08-20 ENCOUNTER — Other Ambulatory Visit: Payer: Self-pay | Admitting: Family Medicine

## 2017-09-10 ENCOUNTER — Encounter: Payer: Medicare Other | Admitting: Family Medicine

## 2017-11-09 NOTE — Progress Notes (Signed)
HPI:  Using dictation device. Unfortunately this device frequently misinterprets words/phrases.  Taylor Kelley is a pleasant 77 y.o. here for follow up. Chronic medical problems summarized below were reviewed for changes and stability and were updated as needed below. These issues and their treatment remain stable for the most part.  She has been working very hard on her diet and has a detailed log of meals. She is very happy with the results, weight reduction and feels better. Did her wellness visit with [name] in May. Talked about various thyroid labs and rational for using TSH to monitor. She takes Armour thyroid - feels better on this. Very functionals, but reported occ memory issues that are chronic. Declined further evaluation for this for now.  Due for flu vaccine. AWV with [name] 07/17/17  Hypothyroidism: -meds: amour thyroid  GERD: -esomeprazole  Insomnia: -meds: trazadone  Seasonal allergies: -meds: xyzal    ROS: See pertinent positives and negatives per HPI.  Past Medical History:  Diagnosis Date  . Allergy   . GERD (gastroesophageal reflux disease)   . Heart murmur   . Osteopenia  04/2013   T score -1.7 FRAX 11%/2.1%  . Thyroid disease   . Urine incontinence   . UTI (urinary tract infection)     Past Surgical History:  Procedure Laterality Date  . BREAST BIOPSY Left    fibroadenoma  . ENDOMETRIAL BIOPSY  2009   polyp    Family History  Problem Relation Age of Onset  . Heart disease Mother   . Stroke Mother   . Hypertension Mother   . Heart disease Father   . Breast cancer Other 50  . Breast cancer Sister 84  . Cancer Paternal Grandfather     SOCIAL HX: se ehpi   Current Outpatient Medications:  .  ARMOUR THYROID 60 MG tablet, TAKE ONE TABLET BY MOUTH EVERY DAY, Disp: 90 tablet, Rfl: 1 .  aspirin 81 MG chewable tablet, Chew 81 mg by mouth daily., Disp: , Rfl:  .  CALCIUM PO, Take by mouth., Disp: , Rfl:  .  Cholecalciferol (VITAMIN D PO), Take by  mouth., Disp: , Rfl:  .  Cyanocobalamin (VITAMIN B-12 PO), Take 5,000 mcg by mouth daily., Disp: , Rfl:  .  Esomeprazole Magnesium (NEXIUM PO), Take 22 mg by mouth., Disp: , Rfl:  .  levocetirizine (XYZAL) 5 MG tablet, Take 5 mg by mouth every evening., Disp: , Rfl:  .  Multiple Vitamin (MULTIVITAMIN) capsule, Take 1 capsule by mouth daily., Disp: , Rfl:  .  traZODone (DESYREL) 150 MG tablet, TAKE ONE TABLET AT BEDTIME, Disp: 90 tablet, Rfl: 3  EXAM:  Vitals:   11/12/17 0927  BP: 136/70  Pulse: 84  Temp: 98.5 F (36.9 C)    Body mass index is 23.6 kg/m.  GENERAL: vitals reviewed and listed above, alert, oriented, appears well hydrated and in no acute distress  HEENT: atraumatic, conjunttiva clear, no obvious abnormalities on inspection of external nose and ears  NECK: no obvious masses on inspection  LUNGS: clear to auscultation bilaterally, no wheezes, rales or rhonchi, good air movement  CV: HRRR, no peripheral edema  MS: moves all extremities without noticeable abnormality  PSYCH: pleasant and cooperative, no obvious depression or anxiety  ASSESSMENT AND PLAN:  Discussed the following assessment and plan:  Hypothyroidism, unspecified type - Plan: TSH  Screening for depression  B12 deficiency - Plan: Vitamin B12  Gastroesophageal reflux disease, esophagitis presence not specified  Chronic insomnia  -labs per  orders -taking very high dose b12 5000 mcg daily - likely does not need and advised 50-535mcg daily pending results -refills not yet due -vit D3 [phone] IU daily recommended -went over her diet in detail and she likely could benefit from increased protein, good fats an increased vegetables - discussed components of healthy Mediterranean diet -follow up 6 months   Patient Instructions  BEFORE YOU LEAVE: -labs -flu shot -follow up: 6 months  We have ordered labs or studies at this visit. It can take up to 1-2 weeks for results and processing. IF  results require follow up or explanation, we will call you with instructions. Clinically stable results will be released to your [facility]. If you have not heard from [place] or cannot find your results in [facility] in 2 weeks please contact our office at [phone].  If you are not yet signed up for [facility], please consider signing up.   We recommend the following healthy lifestyle for LIFE: 1) Small portions. But, make sure to get regular (at least 3 per day), healthy meals and small healthy snacks if needed.  2) Eat a healthy clean diet.   TRY TO EAT: -at least 5-7 servings of low sugar, colorful, and nutrient rich vegetables per day (not corn, potatoes or bananas.) -berries are the best choice if you wish to eat fruit (only eat small amounts if trying to reduce weight)  -lean meets (fish, white meat of chicken or [place]) -vegan proteins for some meals - beans or tofu, whole grains, nuts and seeds -Replace bad fats with good fats - good fats include: fish, nuts and seeds, canola oil, olive oil -small amounts of low fat or non fat dairy -small amounts of100 % whole grains - check the lables -drink plenty of water  AVOID: -SUGAR, sweets, anything with added sugar, corn syrup or sweeteners - must read labels as even foods advertised as "healthy" often are loaded with sugar -if you must have a sweetener, small amounts of stevia may be best -sweetened beverages and artificially sweetened beverages -simple starches (rice, bread, potatoes, pasta, chips, etc - small amounts of 100% whole grains are ok) -red meat, pork, butter -fried foods, fast food, processed food, excessive dairy, eggs and coconut.  3)Get at least 150 minutes of sweaty aerobic exercise per week.  4)Reduce stress - consider counseling, meditation and relaxation to balance other aspects of your life.          Lucretia Kern, DO

## 2017-11-12 ENCOUNTER — Encounter: Payer: Self-pay | Admitting: Family Medicine

## 2017-11-12 ENCOUNTER — Ambulatory Visit (INDEPENDENT_AMBULATORY_CARE_PROVIDER_SITE_OTHER): Payer: Medicare Other | Admitting: Family Medicine

## 2017-11-12 VITALS — BP 136/70 | HR 84 | Temp 98.5°F | Ht 66.0 in | Wt 146.2 lb

## 2017-11-12 DIAGNOSIS — E538 Deficiency of other specified B group vitamins: Secondary | ICD-10-CM | POA: Diagnosis not present

## 2017-11-12 DIAGNOSIS — F5104 Psychophysiologic insomnia: Secondary | ICD-10-CM

## 2017-11-12 DIAGNOSIS — K219 Gastro-esophageal reflux disease without esophagitis: Secondary | ICD-10-CM

## 2017-11-12 DIAGNOSIS — E039 Hypothyroidism, unspecified: Secondary | ICD-10-CM

## 2017-11-12 DIAGNOSIS — Z1331 Encounter for screening for depression: Secondary | ICD-10-CM

## 2017-11-12 DIAGNOSIS — Z23 Encounter for immunization: Secondary | ICD-10-CM

## 2017-11-12 LAB — VITAMIN B12: Vitamin B-12: >1500 pg/mL — ABNORMAL HIGH (ref 211–911)

## 2017-11-12 LAB — TSH: TSH: 0.33 u[IU]/mL — ABNORMAL LOW (ref 0.35–4.50)

## 2017-11-12 NOTE — Addendum Note (Signed)
Addended by: Lahoma Crocker A on: 11/12/2017 10:14 AM   Modules accepted: Orders

## 2017-11-12 NOTE — Patient Instructions (Addendum)
BEFORE YOU LEAVE: -labs -flu shot -follow up: 6 months  We have ordered labs or studies at this visit. It can take up to 1-2 weeks for results and processing. IF results require follow up or explanation, we will call you with instructions. Clinically stable results will be released to your [facility]. If you have not heard from [place] or cannot find your results in [facility] in 2 weeks please contact our office at [phone].  If you are not yet signed up for [facility], please consider signing up.   We recommend the following healthy lifestyle for LIFE: 1) Small portions. But, make sure to get regular (at least 3 per day), healthy meals and small healthy snacks if needed.  2) Eat a healthy clean diet.   TRY TO EAT: -at least 5-7 servings of low sugar, colorful, and nutrient rich vegetables per day (not corn, potatoes or bananas.) -berries are the best choice if you wish to eat fruit (only eat small amounts if trying to reduce weight)  -lean meets (fish, white meat of chicken or [place]) -vegan proteins for some meals - beans or tofu, whole grains, nuts and seeds -Replace bad fats with good fats - good fats include: fish, nuts and seeds, canola oil, olive oil -small amounts of low fat or non fat dairy -small amounts of100 % whole grains - check the lables -drink plenty of water  AVOID: -SUGAR, sweets, anything with added sugar, corn syrup or sweeteners - must read labels as even foods advertised as "healthy" often are loaded with sugar -if you must have a sweetener, small amounts of stevia may be best -sweetened beverages and artificially sweetened beverages -simple starches (rice, bread, potatoes, pasta, chips, etc - small amounts of 100% whole grains are ok) -red meat, pork, butter -fried foods, fast food, processed food, excessive dairy, eggs and coconut.  3)Get at least 150 minutes of sweaty aerobic exercise per week.  4)Reduce stress - consider counseling, meditation and relaxation to  balance other aspects of your life.

## 2017-11-13 NOTE — Addendum Note (Signed)
Addended by: Agnes Lawrence on: 11/13/2017 11:55 AM   Modules accepted: Orders

## 2017-11-20 ENCOUNTER — Other Ambulatory Visit (INDEPENDENT_AMBULATORY_CARE_PROVIDER_SITE_OTHER): Payer: Medicare Other

## 2017-11-20 DIAGNOSIS — E039 Hypothyroidism, unspecified: Secondary | ICD-10-CM | POA: Diagnosis not present

## 2017-11-20 LAB — TSH: TSH: 0.24 u[IU]/mL — ABNORMAL LOW (ref 0.35–4.50)

## 2017-11-20 MED ORDER — THYROID 15 MG PO TABS: ORAL_TABLET | ORAL | 2 refills | Status: DC

## 2017-11-20 NOTE — Addendum Note (Signed)
Addended by: Agnes Lawrence on: 11/20/2017 05:08 PM   Modules accepted: Orders

## 2017-12-25 ENCOUNTER — Ambulatory Visit (INDEPENDENT_AMBULATORY_CARE_PROVIDER_SITE_OTHER): Payer: Medicare Other | Admitting: Sports Medicine

## 2017-12-25 ENCOUNTER — Ambulatory Visit (INDEPENDENT_AMBULATORY_CARE_PROVIDER_SITE_OTHER): Payer: Medicare Other

## 2017-12-25 ENCOUNTER — Encounter: Payer: Self-pay | Admitting: Sports Medicine

## 2017-12-25 ENCOUNTER — Ambulatory Visit: Payer: Self-pay

## 2017-12-25 VITALS — BP 146/80 | HR 66 | Ht 66.0 in | Wt 143.0 lb

## 2017-12-25 DIAGNOSIS — S299XXA Unspecified injury of thorax, initial encounter: Secondary | ICD-10-CM | POA: Diagnosis not present

## 2017-12-25 DIAGNOSIS — R0781 Pleurodynia: Secondary | ICD-10-CM

## 2017-12-25 DIAGNOSIS — S2231XA Fracture of one rib, right side, initial encounter for closed fracture: Secondary | ICD-10-CM

## 2017-12-25 DIAGNOSIS — J3089 Other allergic rhinitis: Secondary | ICD-10-CM | POA: Diagnosis not present

## 2017-12-25 DIAGNOSIS — S3992XA Unspecified injury of lower back, initial encounter: Secondary | ICD-10-CM | POA: Diagnosis not present

## 2017-12-25 MED ORDER — AZELASTINE HCL 0.1 % NA SOLN: 1.0000 | Freq: Two times a day (BID) | NASAL | 2 refills | Status: DC

## 2017-12-25 NOTE — Telephone Encounter (Signed)
Pt called to report that she fell last week against the toilet and is still having back pain.  She rates the pain as a 7. She states it will wake her up at night when she moves. She has the pain when coughing and even when blowing her nose.  Pt states the pain is on the right side just below her shoulder blade. Pt has treated her pain with Advil.  Pt requested to see Dr Paulla Fore for this injury stating "I always see him when I break a bone" Appointment made per protocol. Care advice read to patient. Pt verbalized understanding of all instructions. Reason for Disposition . [1] MODERATE back pain (e.g., interferes with normal activities) AND [2] present > 3 days  Answer Assessment - Initial Assessment Questions 1. ONSET: "When did the pain begin?"      1 week ago 2. LOCATION: "Where does it hurt?" (upper, mid or lower back)     Rt side of back below the shoulder blade 3. SEVERITY: "How bad is the pain?"  (e.g., Scale 1-10; mild, moderate, or severe)   - MILD (1-3): doesn't interfere with normal activities    - MODERATE (4-7): interferes with normal activities or awakens from sleep    - SEVERE (8-10): excruciating pain, unable to do any normal activities      7 4. PATTERN: "Is the pain constant?" (e.g., yes, no; constant, intermittent)      Intermittent with movement 5. RADIATION: "Does the pain shoot into your legs or elsewhere?"     no 6. CAUSE:  "What do you think is causing the back pain?"      Fall against toilet 7. BACK OVERUSE:  "Any recent lifting of heavy objects, strenuous work or exercise?"     no 8. MEDICATIONS: "What have you taken so far for the pain?" (e.g., nothing, acetaminophen, NSAIDS)     Advil 9. NEUROLOGIC SYMPTOMS: "Do you have any weakness, numbness, or problems with bowel/bladder control?"     no 10. OTHER SYMPTOMS: "Do you have any other symptoms?" (e.g., fever, abdominal pain, burning with urination, blood in urine)       no 11. PREGNANCY: "Is there any chance you are  pregnant?" (e.g., yes, no; LMP)       N/A  Protocols used: BACK PAIN-A-AH

## 2017-12-25 NOTE — Assessment & Plan Note (Signed)
Remote fracture of ribs 7 with appears to be far lateral acute fracture of rib 9.  Symptomatic treatment discussed.  Follow-up 3 weeks for repeat clinical exam.  Cautioned on increased risk of pneumonia and she should continue working on deep breathing as tolerated.

## 2017-12-25 NOTE — Assessment & Plan Note (Signed)
Allergic rhinitis is causing her to sneeze multiple times per day which is worsening her rib pain.  Trial of Astelin nasal spray

## 2017-12-25 NOTE — Progress Notes (Signed)
Taylor Kelley. Taylor Kelley, [place] [facility] [place] - 77 y.o. female MRN [id number]  Date of birth: 1941/02/20  Visit Date: 12/25/2017  PCP: Lucretia Kern, DO   Referred by: Lucretia Kern, DO   Scribe(s) for today's visit: Josepha Pigg, CMA  SUBJECTIVE:  Taylor Kelley is here for fall, back pain   HPI: Her back pain symptoms INITIALLY: Began about 1 week ago after falling into the tank on the toilet. She lost her balance when trying to sit.  Described as severe sharp stabbing pain like "somebody kicks me in the back", nonradiating Worsened with sneezing, coughing, blowing nose, laughing.  Improved with rest Additional associated symptoms include: Pain is just below the R scapula. She is unsure if she hit her head. No LOC, HA, visual changes. She has noticed swelling and bruising but no broken skin.     At this time symptoms are improving compared to onset.  She has been taking Advil with some relief, it "takes the edge off".  Warm water from the shower feels good.   REVIEW OF SYSTEMS: Reports night time disturbances. Denies fevers, chills, or night sweats. Denies unexplained weight loss. Denies personal history of cancer. Denies changes in bowel or bladder habits. Reports recent unreported falls. Denies new or worsening dyspnea or wheezing. Denies headaches or dizziness.  Denies numbness, tingling or weakness n the extremities.  Denies dizziness or presyncopal episodes Denies lower extremity edema    HISTORY:  Prior history reviewed and updated per electronic medical record.  Social History   Occupational History  . Not on file  Tobacco Use  . Smoking status: Former Smoker    Packs/day: 1.00    Years: 20.00    Pack years: 20.00    Types: Cigarettes    Last attempt to quit: 06/14/1978    Years since quitting: 39.5  . Smokeless tobacco: Never Used  Substance and Sexual Activity  . Alcohol use: Yes      Alcohol/week: 7.0 standard drinks    Types: 7 Standard drinks or equivalent per week    Comment: [place] roughly 5 days a week   . Drug use: No  . Sexual activity: Yes    Birth control/protection: Post-menopausal   Social History   Social History Narrative  . Not on file     DATA OBTAINED & REVIEWED:  No results for input(s): HGBA1C, LABURIC, CREATINE in the last 8760 hours. Problem  Closed Fracture of One Rib of Right Side  Perennial Allergic Rhinitis   .   OBJECTIVE:  VS:  HT:5\' 6"  (167.6 cm)   WT:143 lb (64.9 kg)  BMI:23.09    BP:(!) 146/80  HR:66bpm  TEMP: ( )  RESP:99 %   PHYSICAL EXAM: CONSTITUTIONAL: Well-developed, Well-nourished and In no acute distress PSYCHIATRIC: Alert & appropriately interactive. and Not depressed or anxious appearing. RESPIRATORY: No increased work of breathing and Trachea Midline EYES: Pupils are equal., EOM intact without nystagmus. and No scleral icterus.  VASCULAR EXAM: Warm and well perfused NEURO: unremarkable  MSK Exam: Chest wall:  Well aligned, no significant deformity. Bruising and ecchymosis over the right lateral chest wall just below the level of the scapula. Focally tender over ribs 9 over the mid axillary line.  This is directly adjacent to the most focal area of bruising and ecchymosis.   RANGE OF MOTION & STRENGTH  Normal, non-painful Thoracic flexion and extension.  Pain with thoracic rotation  to the right and left.  Sidebending is painful as well.  Mild pain with deep breathing.  No subcutaneous crepitation.   SPECIALITY TESTING:  No significant step-off of the ribs.  No midline tenderness over the vertebral bodies.     ASSESSMENT   1. Rib pain on right side   2. Closed fracture of one rib of right side, initial encounter   3. Perennial allergic rhinitis     PLAN:  Pertinent additional documentation may be included in corresponding procedure notes, imaging studies, problem based documentation and  patient instructions.  Procedures:  . None  Medications:  Meds ordered this encounter  Medications  . azelastine (ASTELIN) 0.1 % nasal spray    Sig: Place 1 spray into both nostrils 2 (two) times daily. Use in each nostril as directed    Dispense:  30 mL    Refill:  2   Discussion/Instructions: Perennial allergic rhinitis Allergic rhinitis is causing her to sneeze multiple times per day which is worsening her rib pain.  Trial of Astelin nasal spray  Closed fracture of one rib of right side Remote fracture of ribs 7 with appears to be far lateral acute fracture of rib 9.  Symptomatic treatment discussed.  Follow-up 3 weeks for repeat clinical exam.  Cautioned on increased risk of pneumonia and she should continue working on deep breathing as tolerated.  . Discussed red flag symptoms that warrant earlier emergent evaluation and patient voices understanding. . Activity modifications and the importance of avoiding exacerbating activities (limiting pain to no more than a 4 / 10 during or following activity) recommended and discussed. . Discussed appropriate use of both heat and ice with the patient today.   Follow-up:  . Return in about 3 weeks (around 01/15/2018).      CMA/ATC served as Education administrator during this visit. History, Physical, and Plan performed by medical provider. Documentation and orders reviewed and attested to.      Gerda Diss, [facility] Physician

## 2017-12-25 NOTE — Telephone Encounter (Signed)
Noted  

## 2017-12-25 NOTE — Telephone Encounter (Signed)
See note and advise °

## 2017-12-26 NOTE — Progress Notes (Signed)
There is a small nondisplaced rib 9 lateral fracture.  No evidence of pneumothorax.  Agree no other acute fracture is appreciated although there is likely a old rib 7 fracture on the right as well.

## 2018-01-02 ENCOUNTER — Other Ambulatory Visit (INDEPENDENT_AMBULATORY_CARE_PROVIDER_SITE_OTHER): Payer: Medicare Other

## 2018-01-02 DIAGNOSIS — E039 Hypothyroidism, unspecified: Secondary | ICD-10-CM

## 2018-01-02 LAB — TSH: TSH: 1.59 u[IU]/mL (ref 0.35–4.50)

## 2018-01-15 ENCOUNTER — Ambulatory Visit (INDEPENDENT_AMBULATORY_CARE_PROVIDER_SITE_OTHER): Payer: Medicare Other | Admitting: Sports Medicine

## 2018-01-15 ENCOUNTER — Encounter: Payer: Self-pay | Admitting: Sports Medicine

## 2018-01-15 VITALS — BP 122/70 | HR 77 | Ht 66.0 in | Wt 147.6 lb

## 2018-01-15 DIAGNOSIS — R0781 Pleurodynia: Secondary | ICD-10-CM

## 2018-01-15 DIAGNOSIS — S2231XD Fracture of one rib, right side, subsequent encounter for fracture with routine healing: Secondary | ICD-10-CM | POA: Diagnosis not present

## 2018-01-15 NOTE — Progress Notes (Signed)
Taylor Kelley. Taylor Kelley, Taylor Kelley at [place] - 77 y.o. female MRN [id number]  Date of birth: Apr 21, 1940  Visit Date: 01/15/2018  PCP: Taylor Kern, DO   Referred by: Taylor Kern, DO   Scribe(s) for today's visit: Taylor Kelley, CMA  SUBJECTIVE:  Taylor Kelley is here for Follow-up (Rib pain)   HPI: Her back pain symptoms INITIALLY: Began about 1 week ago after falling into the tank on the toilet. She lost her balance when trying to sit.  Described as severe sharp stabbing pain like "somebody kicks me in the back", nonradiating Worsened with sneezing, coughing, blowing nose, laughing.  Improved with rest Additional associated symptoms include: Pain is just below the R scapula. She is unsure if she hit her head. No LOC, HA, visual changes. She has noticed swelling and bruising but no broken skin.     At this time symptoms are improving compared to onset.  She has been taking Advil with some relief, it "takes the edge off".  Warm water from the shower feels good.  01/15/2018: Compared to the last office visit on 12/25/17, her previously described rib symptoms are improving, noting a 90-95% improvement.  The only thing she is cautious with /afraid to do is heavier lifting. Current symptoms are none & are nonradiating She has not been taking any medication at this point.  T-spine, L-spine XR - 12/25/17  REVIEW OF SYSTEMS: Reports night time disturbances. Denies fevers, chills, or night sweats. Denies unexplained weight loss. Denies personal history of cancer. Denies changes in bowel or bladder habits. Reports recent unreported falls. Denies new or worsening dyspnea or wheezing. Denies headaches or dizziness.  Denies numbness, tingling or weakness n the extremities.  Denies dizziness or presyncopal episodes Denies lower extremity edema    HISTORY:  Prior history reviewed and updated per electronic  medical record.  Social History   Occupational History  . Not on file  Tobacco Use  . Smoking status: Former Smoker    Packs/day: 1.00    Years: 20.00    Pack years: 20.00    Types: Cigarettes    Last attempt to quit: 06/14/1978    Years since quitting: 39.6  . Smokeless tobacco: Never Used  Substance and Sexual Activity  . Alcohol use: Yes    Alcohol/week: 7.0 standard drinks    Types: 7 Standard drinks or equivalent per week    Comment: [place] roughly 5 days a week   . Drug use: No  . Sexual activity: Yes    Birth control/protection: Post-menopausal   Social History   Social History Narrative  . Not on file     DATA OBTAINED & REVIEWED:  No results for input(s): HGBA1C, LABURIC, CREATINE in the last 8760 hours. No problems updated. .   OBJECTIVE:  VS:  HT:5\' 6"  (167.6 cm)   WT:147 lb 9.6 oz (67 kg)  BMI:23.83    BP:122/70  HR:77bpm  TEMP: ( )  RESP:94 %   PHYSICAL EXAM: CONSTITUTIONAL: Well-developed, Well-nourished and In no acute distress PSYCHIATRIC: Alert & appropriately interactive. and Not depressed or anxious appearing. RESPIRATORY: No increased work of breathing and Trachea Midline EYES: Pupils are equal., EOM intact without nystagmus. and No scleral icterus.  VASCULAR EXAM: Warm and well perfused NEURO: unremarkable  MSK Exam: Chest wall:  Well aligned, no significant deformity. No overlying skin changes. No focal bony tenderness   RANGE OF MOTION & STRENGTH  Normal, non-painful Thoracic flexion and extension.     SPECIALITY TESTING:  No significant step-off of the ribs.  No midline tenderness over the vertebral bodies.     ASSESSMENT   1. Rib pain on right side   2. Closed fracture of one rib of right side with routine healing, subsequent encounter     PLAN:  Pertinent additional documentation may be included in corresponding procedure notes, imaging studies, problem based documentation and patient instructions.  Procedures:    . None  Medications:  No orders of the defined types were placed in this encounter.  Discussion/Instructions: No problem-specific Assessment & Plan notes found for this encounter.  . Overall doing quite well.  Healing from the presumed rib fractures.  Without complication.  Still some minimal stiffness and will have her continue working on therapeutic exercises including deep breathing and thoracic rotation but she should continue to do well. . Discussed red flag symptoms that warrant earlier emergent evaluation and patient voices understanding. . Activity modifications and the importance of avoiding exacerbating activities (limiting pain to no more than a 4 / 10 during or following activity) recommended and discussed. . Discussed appropriate use of both heat and ice with the patient today.   Follow-up:  . Return if symptoms worsen or fail to improve.      CMA/ATC served as Education administrator during this visit. History, Physical, and Plan performed by medical provider. Documentation and orders reviewed and attested to.      Gerda Diss, [facility] Physician

## 2018-01-19 ENCOUNTER — Encounter: Payer: Self-pay | Admitting: Sports Medicine

## 2018-02-07 ENCOUNTER — Other Ambulatory Visit: Payer: Self-pay | Admitting: Family Medicine

## 2018-02-08 NOTE — Telephone Encounter (Signed)
Dr. Maudie Mercury patient. Last OV 11/12/17, Next OV 05/13/18

## 2018-03-04 ENCOUNTER — Other Ambulatory Visit: Payer: Self-pay | Admitting: Sports Medicine

## 2018-04-18 DIAGNOSIS — H524 Presbyopia: Secondary | ICD-10-CM | POA: Diagnosis not present

## 2018-04-18 DIAGNOSIS — H52203 Unspecified astigmatism, bilateral: Secondary | ICD-10-CM | POA: Diagnosis not present

## 2018-04-18 DIAGNOSIS — H25013 Cortical age-related cataract, bilateral: Secondary | ICD-10-CM | POA: Diagnosis not present

## 2018-05-13 ENCOUNTER — Ambulatory Visit: Payer: Medicare Other | Admitting: Family Medicine

## 2018-07-15 IMAGING — DX DG KNEE 1-2V*R*
2 series · 2 of 2 positions shown · non-contrast
Comparison: Right knee x-rays and CT right knee dated February 28, 2017.

CLINICAL DATA: Fibular head fracture follow-up.

EXAM:
RIGHT KNEE - 1-2 VIEW

[knee standing ap]
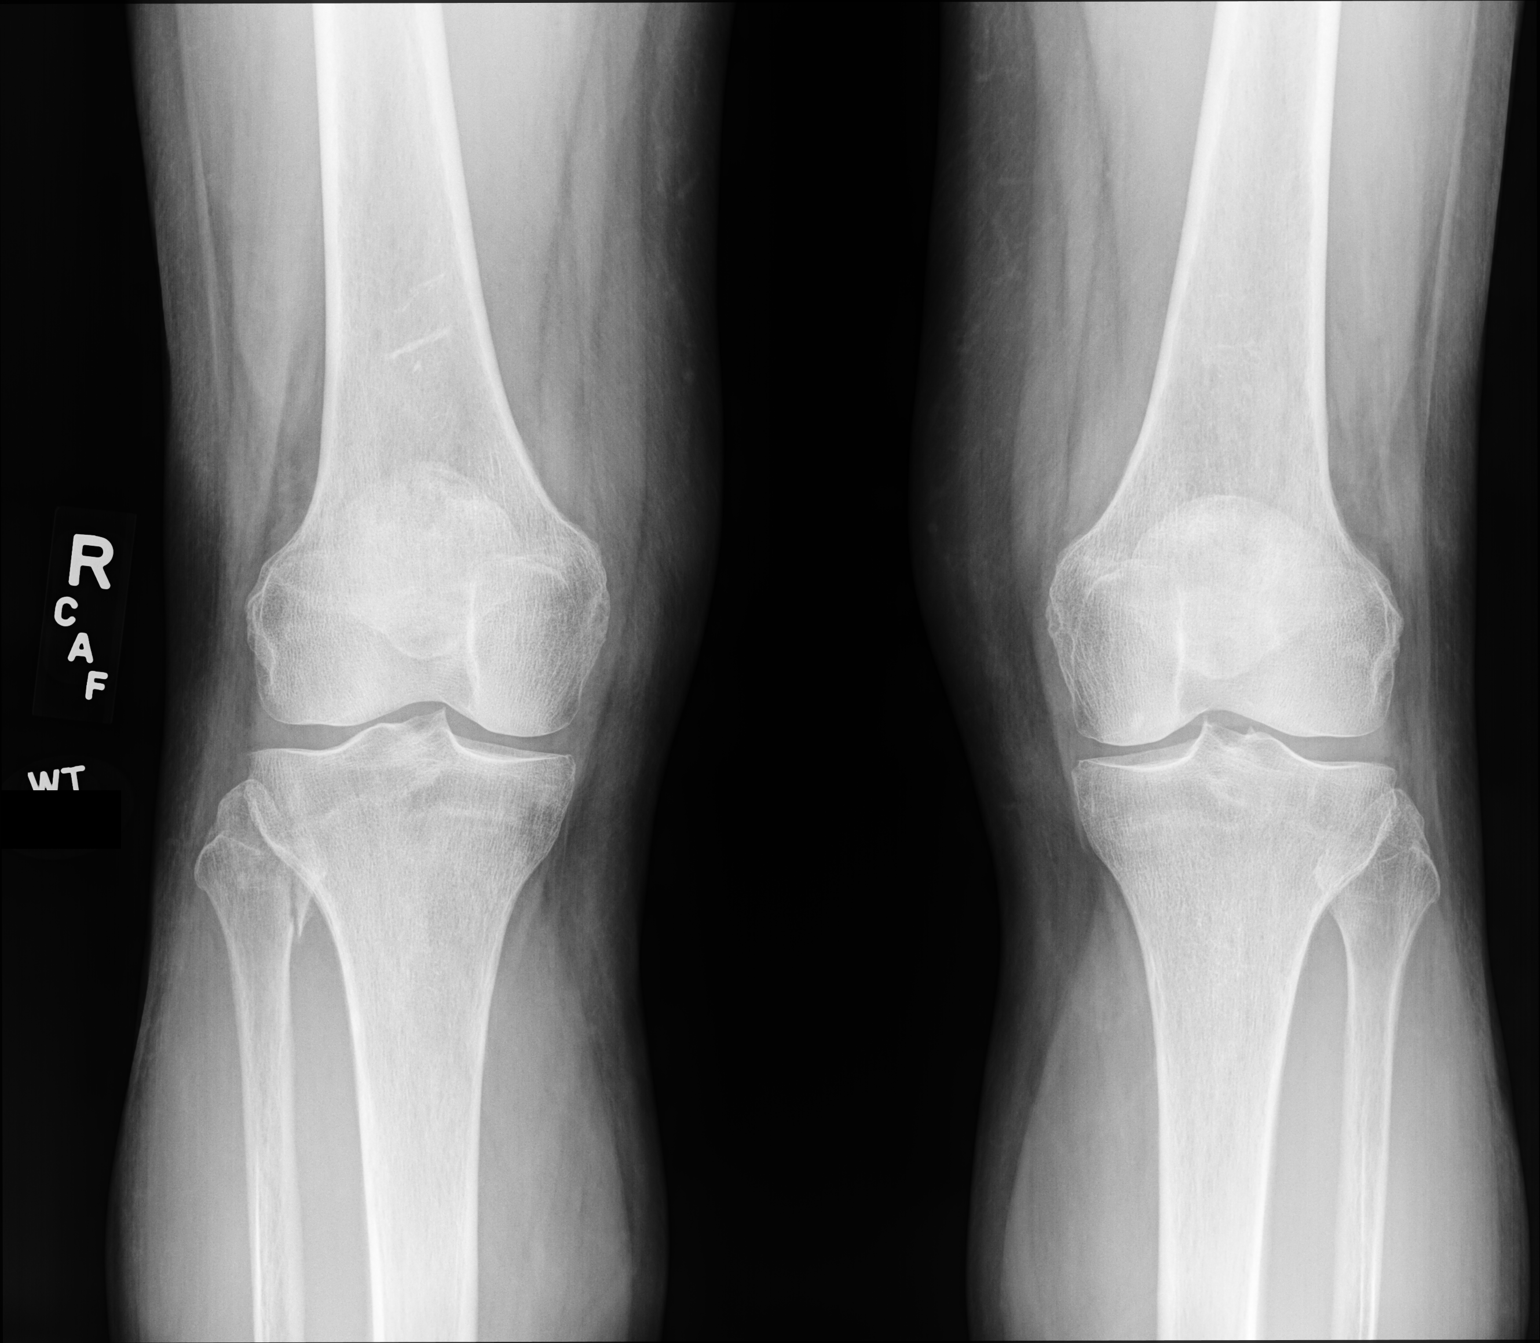

[knee standing lat]
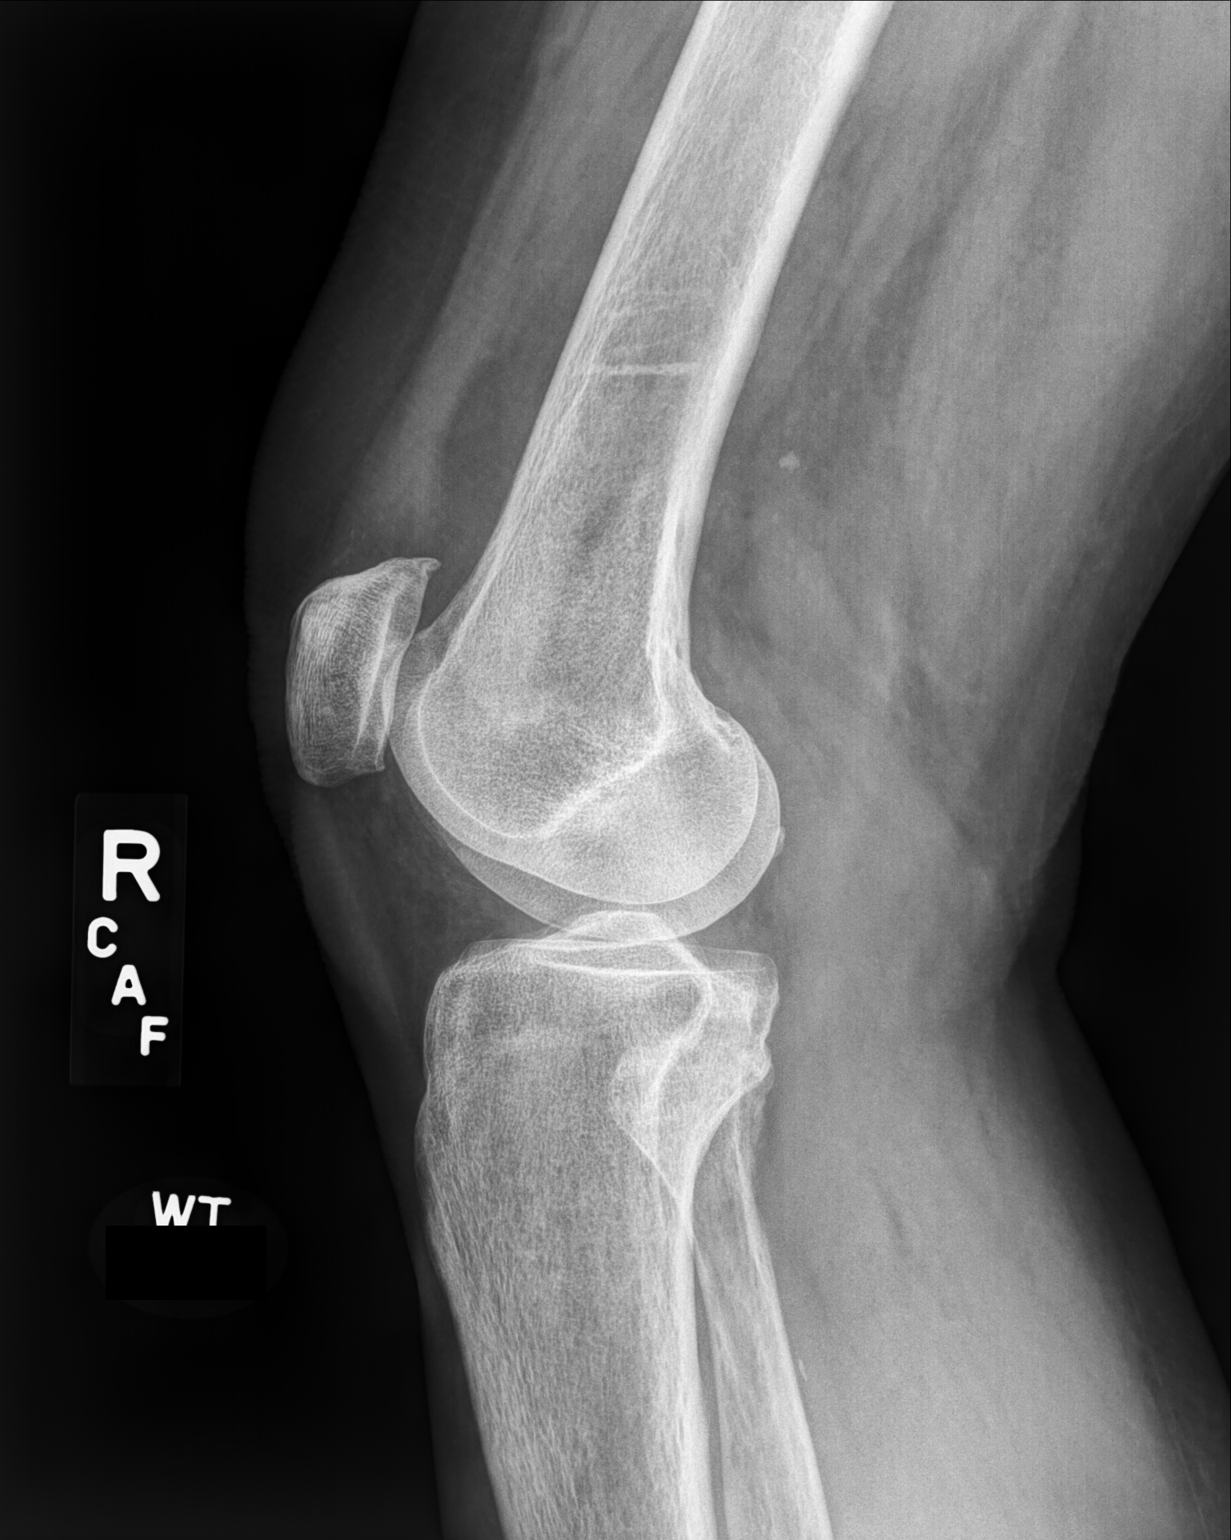

[2 of 2 positions shown; findings below may reference images not displayed]

FINDINGS: Again seen is a nondisplaced, vertical fracture through the right
fibular head. Minimal callus formation. Joint spaces are preserved.
Small patellar osteophytes. No joint effusion. Soft tissues are
unremarkable.
IMPRESSION: Slight interval healing of nondisplaced right fibular head fracture.

## 2018-07-23 ENCOUNTER — Ambulatory Visit: Payer: Medicare Other

## 2018-08-02 ENCOUNTER — Telehealth: Payer: Self-pay | Admitting: *Deleted

## 2018-08-02 NOTE — Telephone Encounter (Signed)
Ok for TSH; might want to do TOC visit first though and make sure not due for other bloodwork. Has been long time since lipids checked for example.

## 2018-08-02 NOTE — Telephone Encounter (Signed)
Copied from [place] [id number]. Topic: General - Inquiry >> Aug 02, 2018 10:16 AM Virl Axe D wrote: Reason for CRM: Pt stated she would like to request lab work due to the change in her ARMOUR THYROID 60 MG tablet . Please advise. CB#[phone] (

## 2018-08-05 NOTE — Telephone Encounter (Signed)
I called the pt and informed her of the message below.  Transfer of care appt scheduled with Dr Ethlyn Gallery and the pt agreed to discuss lab testing at this visit.

## 2018-08-06 ENCOUNTER — Telehealth: Payer: Self-pay | Admitting: *Deleted

## 2018-08-06 NOTE — Telephone Encounter (Signed)
Copied from [place] [id number]. Topic: General - Inquiry >> Aug 02, 2018 10:16 AM Virl Axe D wrote: Reason for CRM: Pt stated she would like to request lab work due to the change in her ARMOUR THYROID 60 MG tablet . Please advise. CB#[phone] (

## 2018-08-06 NOTE — Telephone Encounter (Signed)
See phone note

## 2018-09-04 ENCOUNTER — Ambulatory Visit (INDEPENDENT_AMBULATORY_CARE_PROVIDER_SITE_OTHER): Payer: Medicare Other | Admitting: Family Medicine

## 2018-09-04 ENCOUNTER — Other Ambulatory Visit: Payer: Self-pay

## 2018-09-04 ENCOUNTER — Encounter: Payer: Self-pay | Admitting: Family Medicine

## 2018-09-04 DIAGNOSIS — M85869 Other specified disorders of bone density and structure, unspecified lower leg: Secondary | ICD-10-CM

## 2018-09-04 DIAGNOSIS — K219 Gastro-esophageal reflux disease without esophagitis: Secondary | ICD-10-CM

## 2018-09-04 DIAGNOSIS — F5104 Psychophysiologic insomnia: Secondary | ICD-10-CM

## 2018-09-04 DIAGNOSIS — J3089 Other allergic rhinitis: Secondary | ICD-10-CM

## 2018-09-04 DIAGNOSIS — L659 Nonscarring hair loss, unspecified: Secondary | ICD-10-CM

## 2018-09-04 DIAGNOSIS — E039 Hypothyroidism, unspecified: Secondary | ICD-10-CM

## 2018-09-04 DIAGNOSIS — E538 Deficiency of other specified B group vitamins: Secondary | ICD-10-CM

## 2018-09-04 DIAGNOSIS — Z13220 Encounter for screening for lipoid disorders: Secondary | ICD-10-CM | POA: Diagnosis not present

## 2018-09-04 NOTE — Progress Notes (Signed)
Virtual Visit via Video Note  I connected with Taylor Kelley   on 09/04/18 at  1:30 PM EDT by a video enabled telemedicine application and verified that I am speaking with the correct person using two identifiers.  Location patient: home Location provider:work office Persons participating in the virtual visit: patient, provider  I discussed the limitations of evaluation and management by telemedicine and the availability of in person appointments. The patient expressed understanding and agreed to proceed.   Taylor Kelley DOB: 1940/12/11 Encounter date: 09/04/2018  This is a 78 y.o. female who presents to establish care. Chief Complaint  Patient presents with  . Establish Care    History of present illness: Would like to have thyroid rechecked. All issues are related to thyroid - dry skin, hair falling out, etc. These sx have been better controlled in past. Sleep worse, skin worse.   GERD:nexium - sleeps with elevated head of bed at night which has been biggest help. Tries to take the nexium as little as possible.  Allergies:xyzal Osteopenia: typically does weight bearing exercise. Stopped doing additional supplementation.  Hypothyroid:armour thyroid 60mg  daily for a few years, but cut back last time so she takes 60mg  alternating with half tab daily.  Insomnia:trazodone 150mg ; not sleeping well (thinks might be thyroid acting up). Falling asleep is not issue, but staying asleep is. Has been up today since 4am. Feels tired after dinner. Just feels annoyed that she can't get good nights sleep.   Switched from individual vitamin to multivitamin.   Was walking 1-2 miles daily prior to [place].    Past Medical History:  Diagnosis Date  . Allergy   . GERD (gastroesophageal reflux disease)   . Heart murmur   . Osteopenia  04/2013   T score -1.7 FRAX 11%/2.1%  . Thyroid disease   . Urine incontinence   . UTI (urinary tract infection)    Past Surgical History:  Procedure Laterality  Date  . BREAST BIOPSY Left    fibroadenoma  . ENDOMETRIAL BIOPSY  2009   polyp   Allergies  Allergen Reactions  . Other     Perfume and after shaves; and some environmental    Current Meds  Medication Sig  . Esomeprazole Magnesium (NEXIUM PO) Take 22 mg by mouth.  . levocetirizine (XYZAL) 5 MG tablet Take 5 mg by mouth every evening.  . Multiple Vitamin (MULTIVITAMIN) capsule Take 1 capsule by mouth daily.  . Probiotic Product (PROBIOTIC PO) Take by mouth.  . thyroid (ARMOUR) 60 MG tablet Take 60 mg by mouth daily before breakfast. Alternates with one a day, then half next day  . traZODone (DESYREL) 150 MG tablet TAKE ONE TABLET AT BEDTIME  . [DISCONTINUED] ARMOUR THYROID 60 MG tablet TAKE ONE TABLET BY MOUTH EVERY DAY  . [DISCONTINUED] aspirin 81 MG chewable tablet Chew 81 mg by mouth daily.  . [DISCONTINUED] azelastine (ASTELIN) 0.1 % nasal spray Place 1 spray into both nostrils 2 (two) times daily. Use in each nostril as directed  . [DISCONTINUED] CALCIUM PO Take by mouth.  . [DISCONTINUED] Cholecalciferol (VITAMIN D PO) Take by mouth.  . [DISCONTINUED] Cyanocobalamin (VITAMIN B-12 PO) Take 5,000 mcg by mouth daily.  . [DISCONTINUED] thyroid (ARMOUR THYROID) 15 MG tablet Take 3 tablets by mouth once a day   Social History   Tobacco Use  . Smoking status: Former Smoker    Packs/day: 1.00    Years: 20.00    Pack years: 20.00    Types: Cigarettes  Quit date: 06/14/1978    Years since quitting: 40.2  . Smokeless tobacco: Never Used  Substance Use Topics  . Alcohol use: Yes    Alcohol/week: 7.0 standard drinks    Types: 7 Standard drinks or equivalent per week    Comment: [place] roughly 5 days a week    Family History  Problem Relation Age of Onset  . Heart disease Mother   . Stroke Mother   . Hypertension Mother   . Heart disease Father   . Breast cancer Other 50  . Breast cancer Sister 71  . Cancer Paternal Grandfather   . Ovarian cancer Sister      Review  of Systems  Constitutional: Negative for chills, fatigue and fever.  Respiratory: Negative for cough, chest tightness, shortness of breath and wheezing.   Cardiovascular: Negative for chest pain, palpitations and leg swelling.    Objective:  There were no vitals taken for this visit.      BP Readings from Last 3 Encounters:  01/15/18 122/70  12/25/17 (!) 146/80  11/12/17 136/70   Wt Readings from Last 3 Encounters:  01/15/18 147 lb 9.6 oz (67 kg)  12/25/17 143 lb (64.9 kg)  11/12/17 146 lb 3.2 oz (66.3 kg)    EXAM:  GENERAL: alert, oriented, appears well and in no acute distress  HEENT: atraumatic, conjunctiva clear, no obvious abnormalities on inspection of external nose and ears  NECK: normal movements of the head and neck  LUNGS: on inspection no signs of respiratory distress, breathing rate appears normal, no obvious gross SOB, gasping or wheezing  CV: no obvious cyanosis  MS: moves all visible extremities without noticeable abnormality  PSYCH/NEURO: pleasant and cooperative, no obvious depression or anxiety, speech and thought processing grossly intact   Assessment/Plan  1. Gastroesophageal reflux disease, esophagitis presence not specified Intermittent nexium use controls sx.  2. Hypothyroidism, unspecified type Armour thyroid 60/30 alternating. Recheck bloodwork. - TSH; Future  3. Osteopenia of lower leg, unspecified laterality Weight bearing exercise. Vitamin d/ca in multivitamin.  4. Chronic insomnia Trazodone is helpful for this. 150mg  qhs.  5. Perennial allergic rhinitis Stable with xyzal. Allergy control also helps her GERD sx.  6. B12 deficiency - Vitamin B12; Future  7. Lipid screening - Lipid panel; Future  8. Hair loss - CBC with Differential/Platelet; Future - Comprehensive metabolic panel; Future   Return bloodwork soon; CCV pending results.  I discussed the assessment and treatment plan with the patient. The patient was provided  an opportunity to ask questions and all were answered. The patient agreed with the plan and demonstrated an understanding of the instructions.   The patient was advised to call back or seek an in-person evaluation if the symptoms worsen or if the condition fails to improve as anticipated.  I provided 25 minutes of non-face-to-face time during this encounter.   Micheline Rough, MD

## 2018-09-05 ENCOUNTER — Telehealth: Payer: Self-pay | Admitting: *Deleted

## 2018-09-05 NOTE — Telephone Encounter (Signed)
-----   Message from Caren Macadam, MD sent at 09/05/2018 12:01 AM EDT ----- Please schedule bloodwork for her

## 2018-09-05 NOTE — Telephone Encounter (Signed)
No answer at the pts home number-will attempt to call back later.

## 2018-09-09 NOTE — Telephone Encounter (Signed)
Appt scheduled for 7/23 at 10:30am.

## 2018-09-12 ENCOUNTER — Ambulatory Visit: Payer: Medicare Other

## 2018-09-12 ENCOUNTER — Other Ambulatory Visit: Payer: Self-pay

## 2018-09-12 ENCOUNTER — Other Ambulatory Visit (INDEPENDENT_AMBULATORY_CARE_PROVIDER_SITE_OTHER): Payer: Medicare Other

## 2018-09-12 DIAGNOSIS — Z13220 Encounter for screening for lipoid disorders: Secondary | ICD-10-CM | POA: Diagnosis not present

## 2018-09-12 DIAGNOSIS — E538 Deficiency of other specified B group vitamins: Secondary | ICD-10-CM | POA: Diagnosis not present

## 2018-09-12 DIAGNOSIS — L659 Nonscarring hair loss, unspecified: Secondary | ICD-10-CM

## 2018-09-12 DIAGNOSIS — E039 Hypothyroidism, unspecified: Secondary | ICD-10-CM | POA: Diagnosis not present

## 2018-09-12 LAB — LIPID PANEL
Cholesterol: 235 mg/dL — ABNORMAL HIGH (ref 0–200)
HDL: 98.1 mg/dL (ref >39.00)
LDL Cholesterol: 119 mg/dL — ABNORMAL HIGH (ref 0–99)
NonHDL: 136.85
Total CHOL/HDL Ratio: 2
Triglycerides: 91 mg/dL (ref 0.0–149.0)
VLDL: 18.2 mg/dL (ref 0.0–40.0)

## 2018-09-12 LAB — CBC WITH DIFFERENTIAL/PLATELET
Basophils Absolute: 0 10*3/uL (ref 0.0–0.1)
Basophils Relative: 0.8 % (ref 0.0–3.0)
Eosinophils Absolute: 0.1 10*3/uL (ref 0.0–0.7)
Eosinophils Relative: 2.5 % (ref 0.0–5.0)
HCT: 43.9 % (ref 36.0–46.0)
Hemoglobin: 14.7 g/dL (ref 12.0–15.0)
Lymphocytes Relative: 45.2 % (ref 12.0–46.0)
Lymphs Abs: 1.9 10*3/uL (ref 0.7–4.0)
MCHC: 33.4 g/dL (ref 30.0–36.0)
MCV: 97.1 fl (ref 78.0–100.0)
Monocytes Absolute: 0.4 10*3/uL (ref 0.1–1.0)
Monocytes Relative: 9.7 % (ref 3.0–12.0)
Neutro Abs: 1.8 10*3/uL (ref 1.4–7.7)
Neutrophils Relative %: 41.8 % — ABNORMAL LOW (ref 43.0–77.0)
Platelets: 152 10*3/uL (ref 150.0–400.0)
RBC: 4.53 Mil/uL (ref 3.87–5.11)
RDW: 13.2 % (ref 11.5–15.5)
WBC: 4.3 10*3/uL (ref 4.0–10.5)

## 2018-09-12 LAB — COMPREHENSIVE METABOLIC PANEL
ALT: 15 U/L (ref 0–35)
AST: 21 U/L (ref 0–37)
Albumin: 4.6 g/dL (ref 3.5–5.2)
Alkaline Phosphatase: 63 U/L (ref 39–117)
BUN: 15 mg/dL (ref 6–23)
CO2: 29 mEq/L (ref 19–32)
Calcium: 9.8 mg/dL (ref 8.4–10.5)
Chloride: 100 mEq/L (ref 96–112)
Creatinine, Ser: 0.81 mg/dL (ref 0.40–1.20)
GFR: 68.47 mL/min (ref >60.00)
Glucose, Bld: 78 mg/dL (ref 70–99)
Potassium: 4.3 mEq/L (ref 3.5–5.1)
Sodium: 139 mEq/L (ref 135–145)
Total Bilirubin: 0.8 mg/dL (ref 0.2–1.2)
Total Protein: 7.3 g/dL (ref 6.0–8.3)

## 2018-09-12 LAB — TSH: TSH: 1.25 u[IU]/mL (ref 0.35–4.50)

## 2018-09-12 LAB — VITAMIN B12: Vitamin B-12: 563 pg/mL (ref 211–911)

## 2018-09-19 ENCOUNTER — Other Ambulatory Visit: Payer: Self-pay | Admitting: Family Medicine

## 2018-09-19 ENCOUNTER — Telehealth: Payer: Self-pay | Admitting: *Deleted

## 2018-09-19 MED ORDER — THYROID 60 MG PO TABS: 60.0000 mg | ORAL_TABLET | Freq: Every day | ORAL | 3 refills | Status: DC

## 2018-09-19 NOTE — Telephone Encounter (Signed)
See results note. 

## 2018-09-19 NOTE — Telephone Encounter (Signed)
Copied from [place] [id number] [id number]. Topic: General - Inquiry >> Sep 18, 2018 12:23 PM Virl Axe D wrote: Reason for CRM: Pt stated she had labs done on 09/12/18 and has not been given the results. Would like a callback with results. No notes indicating NT can disclose. Please advise. CB#[phone]

## 2018-11-22 DIAGNOSIS — Z23 Encounter for immunization: Secondary | ICD-10-CM | POA: Diagnosis not present

## 2018-12-20 ENCOUNTER — Other Ambulatory Visit: Payer: Self-pay

## 2018-12-20 ENCOUNTER — Ambulatory Visit (HOSPITAL_COMMUNITY)
Admission: EM | Admit: 2018-12-20 | Discharge: 2018-12-20 | Disposition: A | Discharge status: Home / Self Care | Payer: Medicare Other | Attending: Family Medicine | Admitting: Family Medicine

## 2018-12-20 DIAGNOSIS — N3000 Acute cystitis without hematuria: Secondary | ICD-10-CM | POA: Diagnosis not present

## 2018-12-20 DIAGNOSIS — R300 Dysuria: Secondary | ICD-10-CM | POA: Diagnosis not present

## 2018-12-20 LAB — POCT URINALYSIS DIP (DEVICE)
Bilirubin Urine: NEGATIVE
Glucose, UA: NEGATIVE mg/dL
Ketones, ur: NEGATIVE mg/dL
Nitrite: NEGATIVE
Protein, ur: >=300 mg/dL — AB
Specific Gravity, Urine: >=1.03 (ref 1.005–1.030)
Urobilinogen, UA: 0.2 mg/dL (ref 0.0–1.0)
pH: 5.5 (ref 5.0–8.0)

## 2018-12-20 MED ORDER — NITROFURANTOIN MONOHYD MACRO 100 MG PO CAPS: 100.0000 mg | ORAL_CAPSULE | Freq: Two times a day (BID) | ORAL | 0 refills | Status: AC

## 2018-12-20 NOTE — ED Triage Notes (Signed)
Pt presents to UC w/ c/o cloudy urine, odorous urine, dysuria, and and difficulty urinating x4-5 days.

## 2018-12-20 NOTE — Discharge Instructions (Signed)
Urine showed evidence of infection. We are treating you with macrobid- twice daily for 5 days. Be sure to take full course. Stay hydrated- urine should be pale yellow to clear.  °Please return or follow up with your primary provider if symptoms not improving with treatment. Please return sooner if you have worsening of symptoms or develop fever, nausea, vomiting, abdominal pain, back pain, lightheadedness, dizziness. °

## 2018-12-21 NOTE — ED Provider Notes (Signed)
[facility]    CSN: [id number] Arrival date & time: 12/20/18  [facility]      History   Chief Complaint Chief Complaint  Patient presents with  . UTI sx    HPI Taylor Kelley is a 78 y.o. female history of GERD, hypothyroid, presenting today for evaluation of possible UTI.  Patient states that over the past 4 to 5 days she has had dysuria, urinary frequency, urgency, incomplete voiding, malodorous urine as well as cloudy urine.  States that this feels very similar to previous UTIs.  Typically has at least one UTI per year.  She denies any fevers chills or body aches.  Denies nausea or vomiting.  Denies abdominal pain or back pain.  HPI  Past Medical History:  Diagnosis Date  . Allergy   . GERD (gastroesophageal reflux disease)   . Heart murmur   . Osteopenia  04/2013   T score -1.7 FRAX 11%/2.1%  . Thyroid disease   . Urine incontinence   . UTI (urinary tract infection)     Patient Active Problem List   Diagnosis Date Noted  . Closed fracture of one rib of right side 12/25/2017  . Closed fracture of upper end of right fibula 02/28/2017  . Effusion of right knee 02/28/2017  . Osteopenia 04/20/2013  . GERD (gastroesophageal reflux disease) 06/14/2011  . Perennial allergic rhinitis 06/14/2011  . Hypothyroid 06/14/2011  . Chronic insomnia 06/14/2011    Past Surgical History:  Procedure Laterality Date  . BREAST BIOPSY Left    fibroadenoma  . ENDOMETRIAL BIOPSY  2009   polyp    OB History    Gravida  3   Para  3   Term  3   Preterm      AB      Living  3     SAB      TAB      Ectopic      Multiple      Live Births               Home Medications    Prior to Admission medications   Medication Sig Start Date End Date Taking? Authorizing Provider  levocetirizine (XYZAL) 5 MG tablet Take 5 mg by mouth every evening.    [provider]  nitrofurantoin, macrocrystal-monohydrate, (MACROBID) 100 MG capsule Take 1 capsule (100 mg  total) by mouth 2 (two) times daily for 5 days. 12/20/18 12/25/18  Caliph [place]ak C, PA-C  thyroid (ARMOUR) 60 MG tablet Take 1 tablet (60 mg total) by mouth daily before breakfast. Alternates with one a day, then half next day 09/19/18   Caren Macadam, MD  traZODone (DESYREL) 150 MG tablet TAKE ONE TABLET AT BEDTIME 02/08/18   Caren Macadam, MD    Family History Family History  Problem Relation Age of Onset  . Heart disease Mother   . Stroke Mother   . Hypertension Mother   . Heart disease Father   . Breast cancer Other 50  . Breast cancer Sister 18  . Cancer Paternal Grandfather   . Ovarian cancer Sister     Social History Social History   Tobacco Use  . Smoking status: Former Smoker    Packs/day: 1.00    Years: 20.00    Pack years: 20.00    Types: Cigarettes    Quit date: 06/14/1978    Years since quitting: 40.5  . Smokeless tobacco: Never Used  Substance Use Topics  . Alcohol use:  Yes    Alcohol/week: 7.0 standard drinks    Types: 7 Standard drinks or equivalent per week    Comment: [place] roughly 5 days a week   . Drug use: No     Allergies   Other   Review of Systems Review of Systems  Constitutional: Negative for fever.  Respiratory: Negative for shortness of breath.   Cardiovascular: Negative for chest pain.  Gastrointestinal: Negative for abdominal pain, diarrhea, nausea and vomiting.  Genitourinary: Positive for dysuria, frequency and urgency. Negative for flank pain, genital sores, hematuria, menstrual problem, vaginal bleeding, vaginal discharge and vaginal pain.  Musculoskeletal: Negative for back pain.  Skin: Negative for rash.  Neurological: Negative for dizziness, light-headedness and headaches.     Physical Exam Triage Vital Signs ED Triage Vitals  Enc Vitals Group     BP 12/20/18 1804 (!) 148/62     Pulse Rate 12/20/18 1804 80     Resp 12/20/18 1804 16     Temp 12/20/18 1804 98.6 F (37 C)     Temp Source 12/20/18 1804  Oral     SpO2 12/20/18 1804 97 %     Weight --      Height --      Head Circumference --      Peak Flow --      Pain Score 12/20/18 1806 0     Pain Loc --      Pain Edu? --      Excl. in [place]? --    No data found.  Updated Vital Signs BP (!) 148/62 (BP Location: Right Arm)   Pulse 80   Temp 98.6 F (37 C) (Oral)   Resp 16   SpO2 97%   Visual Acuity Right Eye Distance:   Left Eye Distance:   Bilateral Distance:    Right Eye Near:   Left Eye Near:    Bilateral Near:     Physical Exam Vitals signs and nursing note reviewed.  Constitutional:      General: She is not in acute distress.    Appearance: She is well-developed.  HENT:     Head: Normocephalic and atraumatic.  Eyes:     Conjunctiva/sclera: Conjunctivae normal.  Neck:     Musculoskeletal: Neck supple.  Cardiovascular:     Rate and Rhythm: Normal rate and regular rhythm.     Heart sounds: No murmur.  Pulmonary:     Effort: Pulmonary effort is normal. No respiratory distress.     Breath sounds: Normal breath sounds.     Comments: Breathing comfortably at rest, CTABL, no wheezing, rales or other adventitious sounds auscultated Abdominal:     Palpations: Abdomen is soft.     Tenderness: There is no abdominal tenderness.     Comments: Soft, nondistended, nontender to palpation throughout all 4 quadrants of abdomen, mild tenderness over suprapubic area  No CVA tenderness  Skin:    General: Skin is warm and dry.  Neurological:     Mental Status: She is alert.      UC Treatments / Results  Labs (all labs ordered are listed, but only abnormal results are displayed) Labs Reviewed  POCT URINALYSIS DIP (DEVICE) - Abnormal; Notable for the following components:      Result Value   Hgb urine dipstick MODERATE (*)    Protein, ur >=300 (*)    Leukocytes,Ua SMALL (*)    All other components within normal limits  URINE CULTURE    EKG   Radiology No  results found.  Procedures Procedures (including  critical care time)  Medications Ordered in UC Medications - No data to display  Initial Impression / Assessment and Plan / UC Course  I have reviewed the triage vital signs and the nursing notes.  Pertinent labs & imaging results that were available during my care of the patient were reviewed by me and considered in my medical decision making (see chart for details).     UA consistent with UTI.  Will treat today with Macrobid twice daily x5 days.  Will send for urine culture to confirm as well as check sensitivities.  Will call with results and alter treatment if needed.  Vital signs stable, not suggestive of pyelonephritis at this time.  Continue to monitor,Discussed strict return precautions. Patient verbalized understanding and is agreeable with plan.  Final Clinical Impressions(s) / UC Diagnoses   Final diagnoses:  Acute cystitis without hematuria     Discharge Instructions     Urine showed evidence of infection. We are treating you with macrobid- twice daily for 5 days. Be sure to take full course. Stay hydrated- urine should be pale yellow to clear.   Please return or follow up with your primary provider if symptoms not improving with treatment. Please return sooner if you have worsening of symptoms or develop fever, nausea, vomiting, abdominal pain, back pain, lightheadedness, dizziness.   ED Prescriptions    Medication Sig Dispense Auth. Provider   nitrofurantoin, macrocrystal-monohydrate, (MACROBID) 100 MG capsule Take 1 capsule (100 mg total) by mouth 2 (two) times daily for 5 days. 10 capsule Bunny Lowdermilk, [place] C, PA-C     PDMP not reviewed this encounter.   Janith Lima, [place] 12/21/18 [phone]

## 2018-12-23 LAB — URINE CULTURE: Culture: 40000 — AB

## 2018-12-24 ENCOUNTER — Telehealth (HOSPITAL_COMMUNITY): Payer: Self-pay | Admitting: Emergency Medicine

## 2018-12-24 NOTE — Telephone Encounter (Signed)
Urine culture was positive for e coli and was given  macrobid at urgent care visit. Pt contacted and made aware, educated on completing antibiotic and to follow up if symptoms are persistent. Verbalized understanding.    

## 2019-01-06 ENCOUNTER — Telehealth: Payer: Self-pay | Admitting: Family Medicine

## 2019-01-06 ENCOUNTER — Other Ambulatory Visit: Payer: Self-pay | Admitting: Family Medicine

## 2019-01-06 MED ORDER — THYROID 60 MG PO TABS: 60.0000 mg | ORAL_TABLET | Freq: Every day | ORAL | 1 refills | Status: DC

## 2019-01-06 MED ORDER — TRAZODONE HCL 150 MG PO TABS: 150.0000 mg | ORAL_TABLET | Freq: Every day | ORAL | 1 refills | Status: AC

## 2019-01-06 NOTE — Telephone Encounter (Signed)
I spoke with the pts husband and informed him the Rxs will be mailed to the home address.

## 2019-01-06 NOTE — Telephone Encounter (Signed)
Patient is moving  to [place] and is needing all of her Rx printed on a paper copy to take to the Pharmacy there.  Please Advise

## 2019-01-06 NOTE — Telephone Encounter (Signed)
Scripts printed for 6 mo supply

## 2019-01-10 ENCOUNTER — Telehealth: Payer: Self-pay | Admitting: Family Medicine

## 2019-01-10 NOTE — Telephone Encounter (Signed)
Patient husband is calling to request a 33 prescription for the following medications.  And patient husband is requesting if he can come pick up the prescriptions. It would be great. traZODone (DESYREL) 150 MG tablet [id number], thyroid (ARMOUR) 60 MG tablet [id number]   Please advise [phone]

## 2019-01-10 NOTE — Telephone Encounter (Signed)
Spoke with the pt and informed her the Rxs were placed in a box to mailed to her home address on 11/16 and they may have not been actually sent out until the next day.

## 2019-03-28 DIAGNOSIS — Z23 Encounter for immunization: Secondary | ICD-10-CM | POA: Diagnosis not present

## 2019-04-25 DIAGNOSIS — Z23 Encounter for immunization: Secondary | ICD-10-CM | POA: Diagnosis not present

## 2019-04-28 ENCOUNTER — Other Ambulatory Visit: Payer: Self-pay | Admitting: *Deleted

## 2019-04-28 MED ORDER — THYROID 60 MG PO TABS: 60.0000 mg | ORAL_TABLET | Freq: Every day | ORAL | 0 refills | Status: AC

## 2019-04-28 NOTE — Telephone Encounter (Signed)
Rx done. 

## 2020-03-29 ENCOUNTER — Telehealth: Payer: Self-pay | Admitting: Family Medicine

## 2020-03-29 NOTE — Telephone Encounter (Signed)
Tried calling patient to  schedule Medicare Annual Wellness Visit (AWV) either virtually or in office. No answer    Last AWV 07/17/17 please schedule at anytime with [facility] Nurse Health Advisor 1 or 2   This should be a 45 minute visit. Patient also needs appointment with pcp last appointment 09/04/2018 

## 2020-05-19 ENCOUNTER — Telehealth: Payer: Self-pay | Admitting: Family Medicine

## 2020-05-19 NOTE — Telephone Encounter (Signed)
Tried calling patient to  schedule Medicare Annual Wellness Visit (AWV) either virtually or in office. No answer    Last AWV 07/17/17 please schedule at anytime with [facility] Nurse Health Advisor 1 or 2   This should be a 45 minute visit. Patient also needs appointment with pcp last appointment 09/04/2018
